# Patient Record
Sex: Male | Born: 1945 | Race: White | Hispanic: No | Marital: Married | Smoking: Current every day smoker
Health system: Southern US, Community
[De-identification: ages and names within clinical notes are randomized; demographics above are authoritative.]

## PROBLEM LIST (undated history)

## (undated) DIAGNOSIS — C801 Malignant (primary) neoplasm, unspecified: Secondary | ICD-10-CM

## (undated) DIAGNOSIS — I10 Essential (primary) hypertension: Secondary | ICD-10-CM

## (undated) DIAGNOSIS — E7800 Pure hypercholesterolemia, unspecified: Secondary | ICD-10-CM

## (undated) DIAGNOSIS — N400 Enlarged prostate without lower urinary tract symptoms: Secondary | ICD-10-CM

## (undated) DIAGNOSIS — E119 Type 2 diabetes mellitus without complications: Secondary | ICD-10-CM

## (undated) HISTORY — PX: TONSILLECTOMY: SUR1361

## (undated) HISTORY — PX: EVALUATION UNDER ANESTHESIA WITH TEAR DUCT PROBING: SHX5620

## (undated) HISTORY — DX: Essential (primary) hypertension: I10

## (undated) HISTORY — DX: Type 2 diabetes mellitus without complications: E11.9

## (undated) HISTORY — DX: Pure hypercholesterolemia, unspecified: E78.00

---

## 2019-11-05 DIAGNOSIS — Z299 Encounter for prophylactic measures, unspecified: Secondary | ICD-10-CM | POA: Diagnosis not present

## 2019-11-05 DIAGNOSIS — E1165 Type 2 diabetes mellitus with hyperglycemia: Secondary | ICD-10-CM | POA: Diagnosis not present

## 2019-11-05 DIAGNOSIS — I10 Essential (primary) hypertension: Secondary | ICD-10-CM | POA: Diagnosis not present

## 2019-11-05 DIAGNOSIS — Z2821 Immunization not carried out because of patient refusal: Secondary | ICD-10-CM | POA: Diagnosis not present

## 2020-03-01 DIAGNOSIS — F17210 Nicotine dependence, cigarettes, uncomplicated: Secondary | ICD-10-CM | POA: Diagnosis not present

## 2020-03-01 DIAGNOSIS — Z299 Encounter for prophylactic measures, unspecified: Secondary | ICD-10-CM | POA: Diagnosis not present

## 2020-03-01 DIAGNOSIS — E1165 Type 2 diabetes mellitus with hyperglycemia: Secondary | ICD-10-CM | POA: Diagnosis not present

## 2020-03-01 DIAGNOSIS — Z7189 Other specified counseling: Secondary | ICD-10-CM | POA: Diagnosis not present

## 2020-03-01 DIAGNOSIS — E7800 Pure hypercholesterolemia, unspecified: Secondary | ICD-10-CM | POA: Diagnosis not present

## 2020-03-01 DIAGNOSIS — Z0000 Encounter for general adult medical examination without abnormal findings: Secondary | ICD-10-CM | POA: Diagnosis not present

## 2020-03-01 DIAGNOSIS — Z6824 Body mass index (BMI) 24.0-24.9, adult: Secondary | ICD-10-CM | POA: Diagnosis not present

## 2020-03-01 DIAGNOSIS — Z6823 Body mass index (BMI) 23.0-23.9, adult: Secondary | ICD-10-CM | POA: Diagnosis not present

## 2020-03-01 DIAGNOSIS — R5383 Other fatigue: Secondary | ICD-10-CM | POA: Diagnosis not present

## 2020-03-01 DIAGNOSIS — Z79899 Other long term (current) drug therapy: Secondary | ICD-10-CM | POA: Diagnosis not present

## 2020-05-18 DIAGNOSIS — I10 Essential (primary) hypertension: Secondary | ICD-10-CM | POA: Diagnosis not present

## 2020-05-18 DIAGNOSIS — E1142 Type 2 diabetes mellitus with diabetic polyneuropathy: Secondary | ICD-10-CM | POA: Diagnosis not present

## 2020-05-18 DIAGNOSIS — K219 Gastro-esophageal reflux disease without esophagitis: Secondary | ICD-10-CM | POA: Diagnosis not present

## 2020-05-18 DIAGNOSIS — E7849 Other hyperlipidemia: Secondary | ICD-10-CM | POA: Diagnosis not present

## 2020-06-13 DIAGNOSIS — Z299 Encounter for prophylactic measures, unspecified: Secondary | ICD-10-CM | POA: Diagnosis not present

## 2020-06-13 DIAGNOSIS — Z2821 Immunization not carried out because of patient refusal: Secondary | ICD-10-CM | POA: Diagnosis not present

## 2020-06-13 DIAGNOSIS — F17210 Nicotine dependence, cigarettes, uncomplicated: Secondary | ICD-10-CM | POA: Diagnosis not present

## 2020-06-13 DIAGNOSIS — I10 Essential (primary) hypertension: Secondary | ICD-10-CM | POA: Diagnosis not present

## 2020-06-13 DIAGNOSIS — E1165 Type 2 diabetes mellitus with hyperglycemia: Secondary | ICD-10-CM | POA: Diagnosis not present

## 2020-07-17 DIAGNOSIS — E119 Type 2 diabetes mellitus without complications: Secondary | ICD-10-CM | POA: Diagnosis not present

## 2020-07-18 DIAGNOSIS — E1142 Type 2 diabetes mellitus with diabetic polyneuropathy: Secondary | ICD-10-CM | POA: Diagnosis not present

## 2020-07-18 DIAGNOSIS — E7849 Other hyperlipidemia: Secondary | ICD-10-CM | POA: Diagnosis not present

## 2020-07-18 DIAGNOSIS — I10 Essential (primary) hypertension: Secondary | ICD-10-CM | POA: Diagnosis not present

## 2020-07-18 DIAGNOSIS — K219 Gastro-esophageal reflux disease without esophagitis: Secondary | ICD-10-CM | POA: Diagnosis not present

## 2020-08-04 ENCOUNTER — Ambulatory Visit (INDEPENDENT_AMBULATORY_CARE_PROVIDER_SITE_OTHER): Payer: Medicare Other | Admitting: Urology

## 2020-08-04 ENCOUNTER — Other Ambulatory Visit: Payer: Self-pay

## 2020-08-04 ENCOUNTER — Encounter: Payer: Self-pay | Admitting: Urology

## 2020-08-04 VITALS — BP 177/74 | HR 72 | Temp 98.4°F | Ht 72.0 in | Wt 162.0 lb

## 2020-08-04 DIAGNOSIS — R339 Retention of urine, unspecified: Secondary | ICD-10-CM

## 2020-08-04 DIAGNOSIS — R972 Elevated prostate specific antigen [PSA]: Secondary | ICD-10-CM

## 2020-08-04 LAB — URINALYSIS, ROUTINE W REFLEX MICROSCOPIC
Bilirubin, UA: NEGATIVE
Glucose, UA: NEGATIVE
Ketones, UA: NEGATIVE
Leukocytes,UA: NEGATIVE
Nitrite, UA: NEGATIVE
Protein,UA: NEGATIVE
Specific Gravity, UA: 1.02 (ref 1.005–1.030)
Urobilinogen, Ur: 1 mg/dL (ref 0.2–1.0)
pH, UA: 6.5 (ref 5.0–7.5)

## 2020-08-04 LAB — MICROSCOPIC EXAMINATION
Bacteria, UA: NONE SEEN
Epithelial Cells (non renal): NONE SEEN /hpf (ref 0–10)
Renal Epithel, UA: NONE SEEN /hpf
WBC, UA: NONE SEEN /hpf (ref 0–5)

## 2020-08-04 LAB — BLADDER SCAN AMB NON-IMAGING: Scan Result: 234

## 2020-08-04 MED ORDER — ALFUZOSIN HCL ER 10 MG PO TB24: 10.0000 mg | ORAL_TABLET | Freq: Every day | ORAL | 11 refills | Status: DC

## 2020-08-04 NOTE — Patient Instructions (Signed)
Prostate-Specific Antigen Test Why am I having this test? The prostate-specific antigen (PSA) test is a screening test for prostate cancer. It can identify early signs of prostate cancer, which may allow for more effective treatment. Your health care provider may recommend that you have a PSA test starting at age 75 or that you have one earlier or later, depending on your risk factors for prostate cancer. You may also have a PSA test:  To monitor treatment of prostate cancer.  To check whether prostate cancer has returned after treatment.  If you have signs of other conditions that can affect PSA levels, such as: ? An enlarged prostate that is not caused by cancer (benign prostatic hyperplasia, BPH). This condition is very common in older men. ? A prostate infection. What is being tested? This test measures the amount of PSA in your blood. PSA is a protein that is made in the prostate. The prostate naturally produces more PSA as you age, but very high levels may be a sign of a medical condition. What kind of sample is taken?  A blood sample is required for this test. It is usually collected by inserting a needle into a blood vessel or by sticking a finger with a small needle. Blood for this test should be drawn before having an exam of the prostate. How do I prepare for this test? Do not ejaculate starting 24 hours before your test, or as long as told by your health care provider. Tell a health care provider about:  Any allergies you have.  All medicines you are taking, including vitamins, herbs, eye drops, creams, and over-the-counter medicines. This also includes: ? Medicines to assist with hair growth, such as finasteride. ? Any recent exposure to a medicine called diethylstilbestrol.  Any blood disorders you have.  Any recent procedures you have had, especially any procedures involving the prostate or rectum.  Any medical conditions you have.  Any recent urinary tract infections  (UTIs) you have had. How are the results reported? Your test results will be reported as a value that indicates how much PSA is in your blood. This will be given as nanograms of PSA per milliliter of blood (ng/mL). Your health care provider will compare your results to normal ranges that were established after testing a large group of people (reference ranges). Reference ranges may vary among labs and hospitals. PSA levels vary from person to person and generally increase with age. Because of this variation, there is no single PSA value that is considered normal for everyone. Instead, PSA reference ranges are used to describe whether your PSA levels are considered low or high (elevated). Common reference ranges are:  Low: 0-2.5 ng/mL.  Slightly to moderately elevated: 2.6-10.0 ng/mL.  Moderately elevated: 10.0-19.9 ng/mL.  Significantly elevated: 20 ng/mL or greater. Sometimes, the test results may report that a condition is present when it is not present (false-positive result). What do the results mean? A test result that is higher than 4 ng/mL may mean that you are at an increased risk for prostate cancer. However, a PSA test by itself is not enough to diagnose prostate cancer. High PSA levels may also be caused by the natural aging process, prostate infection, or BPH. PSA screening cannot tell you if your PSA is high due to cancer or a different cause. A prostate biopsy is the only way to diagnose prostate cancer. A risk of having the PSA test is diagnosing and treating prostate cancer that would never have caused any   symptoms or problems (overdiagnosis and overtreatment). Talk with your health care provider about what your results mean. Questions to ask your health care provider Ask your health care provider, or the department that is doing the test:  When will my results be ready?  How will I get my results?  What are my treatment options?  What other tests do I need?  What are my  next steps? Summary  The prostate-specific antigen (PSA) test is a screening test for prostate cancer.  Your health care provider may recommend that you have a PSA test starting at age 75 or that you have one earlier or later, depending on your risk factors for prostate cancer.  A test result that is higher than 4 ng/mL may mean that you are at an increased risk for prostate cancer. However, elevated levels can be caused by a number of conditions other than prostate cancer.  Talk with your health care provider about what your results mean. This information is not intended to replace advice given to you by your health care provider. Make sure you discuss any questions you have with your health care provider. Document Revised: 07/18/2017 Document Reviewed: 05/12/2017 Elsevier Patient Education  2020 Elsevier Inc.  

## 2020-08-04 NOTE — Progress Notes (Signed)
Bladder Scan Patient can void: 234 ml Performed By: Eunice Oldaker,lpn   Urological Symptom Review  Patient is experiencing the following symptoms: Frequent urination Get up at night to urinate Stream starts and stops Erection problems (male only)  Weak stream   Review of Systems  Gastrointestinal (upper)  : Indigestion/heartburn  Gastrointestinal (lower) : Negative for lower GI symptoms  Constitutional : Negative for symptoms  Skin: Negative for skin symptoms  Eyes: Negative for eye symptoms  Ear/Nose/Throat : Negative for Ear/Nose/Throat symptoms  Hematologic/Lymphatic: Negative for Hematologic/Lymphatic symptoms  Cardiovascular : Negative for cardiovascular symptoms  Respiratory : Cough  Endocrine: Negative for endocrine symptoms  Musculoskeletal: Negative for musculoskeletal symptoms  Neurological: Negative for neurological symptoms  Psychologic: Negative for psychiatric symptoms

## 2020-08-04 NOTE — Progress Notes (Signed)
08/04/2020 1:59 PM   Caleb Pugh 1945-10-09 [id number]  Referring provider: Monico Blitz, MD [place],  [place] [address]  Elevated PSA  HPI: Caleb Pugh is a 74yo here for evaluation of elevated PSA. PSa was 6.3 in 05/2020. He has moderate LUTS on BPH therapy. He has an intermittent weak stream. Nocturia 2-3x. Occasional hesitancy. No starting/stopping.  No dribbling. NO urinary urgency or frequency. No dysuria or hematuria   PMH: Past Medical History:  Diagnosis Date  . Diabetes mellitus without complication ([place])   . High cholesterol   . Hypertension     Surgical History: History reviewed. No pertinent surgical history.  Home Medications:  Allergies as of 08/04/2020   Not on File     Medication List       Accurate as of August 04, 2020  1:59 PM. If you have any questions, ask your nurse or doctor.        Accu-Chek Guide test strip Generic drug: glucose blood   lisinopril-hydrochlorothiazide 20-12.5 MG tablet Commonly known as: ZESTORETIC Take 1 tablet by mouth 2 (two) times daily.   metFORMIN 500 MG tablet Commonly known as: GLUCOPHAGE Take 500 mg by mouth daily.   simvastatin 40 MG tablet Commonly known as: ZOCOR Take 40 mg by mouth at bedtime.       Allergies: Not on File  Family History: History reviewed. No pertinent family history.  Social History:  reports that he has been smoking. He has a 30.00 pack-year smoking history. He has never used smokeless tobacco. No history on file for alcohol use and drug use.  ROS: All other review of systems were reviewed and are negative except what is noted above in HPI  Physical Exam: BP (!) 177/74   Pulse 72   Temp 98.4 F (36.9 C)   Ht 6' (1.829 m)   Wt 162 lb (73.5 kg)   BMI 21.97 kg/m   Constitutional:  Alert and oriented, No acute distress. HEENT: [place] AT, moist mucus membranes.  Trachea midline, no masses. Cardiovascular: No clubbing, cyanosis, or edema. Respiratory: Normal  respiratory effort, no increased work of breathing. GI: Abdomen is soft, nontender, nondistended, no abdominal masses GU: No CVA tenderness. Circumcised phallus. No masses/lesions on penis, testis, scrotum. Prostate 60g smooth no nodules no induration.  Lymph: No cervical or inguinal lymphadenopathy. Skin: No rashes, bruises or suspicious lesions. Neurologic: Grossly intact, no focal deficits, moving all 4 extremities. Psychiatric: Normal mood and affect.  Laboratory Data: No results found for: WBC, HGB, HCT, MCV, PLT  No results found for: CREATININE  No results found for: PSA  No results found for: TESTOSTERONE  No results found for: HGBA1C  Urinalysis No results found for: COLORURINE, APPEARANCEUR, LABSPEC, PHURINE, GLUCOSEU, HGBUR, BILIRUBINUR, KETONESUR, PROTEINUR, UROBILINOGEN, NITRITE, LEUKOCYTESUR  No results found for: LABMICR, [place], RBCUA, LABEPIT, MUCUS, BACTERIA  Pertinent Imaging:  No results found for this or any previous visit.  No results found for this or any previous visit.  No results found for this or any previous visit.  No results found for this or any previous visit.  No results found for this or any previous visit.  No results found for this or any previous visit.  No results found for this or any previous visit.  No results found for this or any previous visit.   Assessment & Plan:    1. Elevated PSA -The patient and I talked about etiologies of elevated PSA.  We discussed the possible relationship between  elevated PSA and prostate cancer, BPH, prostatitis, infection trauma and recent ejaculations.  The patient's PSA has been relatively stable over the interval and as such I recommended that we follow-up with a repeat PSA in 2 months.  If it remains elevated with a positive rising trend we will discuss prostate biopsy at his follow-up appointment. - Urinalysis, Routine w reflex microscopic - BLADDER SCAN AMB NON-IMAGING  2. Incomplete  emptying of bladder -We will trial uroxatral 10mg  qhs   No follow-ups on file.  Nicolette Bang, MD  [facility] [place]

## 2020-09-18 DIAGNOSIS — E7849 Other hyperlipidemia: Secondary | ICD-10-CM | POA: Diagnosis not present

## 2020-09-18 DIAGNOSIS — I10 Essential (primary) hypertension: Secondary | ICD-10-CM | POA: Diagnosis not present

## 2020-09-18 DIAGNOSIS — K219 Gastro-esophageal reflux disease without esophagitis: Secondary | ICD-10-CM | POA: Diagnosis not present

## 2020-09-18 DIAGNOSIS — E1142 Type 2 diabetes mellitus with diabetic polyneuropathy: Secondary | ICD-10-CM | POA: Diagnosis not present

## 2020-09-20 DIAGNOSIS — F17210 Nicotine dependence, cigarettes, uncomplicated: Secondary | ICD-10-CM | POA: Diagnosis not present

## 2020-09-20 DIAGNOSIS — Z6822 Body mass index (BMI) 22.0-22.9, adult: Secondary | ICD-10-CM | POA: Diagnosis not present

## 2020-09-20 DIAGNOSIS — I10 Essential (primary) hypertension: Secondary | ICD-10-CM | POA: Diagnosis not present

## 2020-09-20 DIAGNOSIS — Z299 Encounter for prophylactic measures, unspecified: Secondary | ICD-10-CM | POA: Diagnosis not present

## 2020-09-20 DIAGNOSIS — E1165 Type 2 diabetes mellitus with hyperglycemia: Secondary | ICD-10-CM | POA: Diagnosis not present

## 2020-09-28 DIAGNOSIS — E119 Type 2 diabetes mellitus without complications: Secondary | ICD-10-CM | POA: Diagnosis not present

## 2020-09-28 DIAGNOSIS — H25813 Combined forms of age-related cataract, bilateral: Secondary | ICD-10-CM | POA: Diagnosis not present

## 2020-09-28 DIAGNOSIS — Q141 Congenital malformation of retina: Secondary | ICD-10-CM | POA: Diagnosis not present

## 2020-09-29 ENCOUNTER — Ambulatory Visit (INDEPENDENT_AMBULATORY_CARE_PROVIDER_SITE_OTHER): Payer: Medicare Other | Admitting: Urology

## 2020-09-29 ENCOUNTER — Other Ambulatory Visit: Payer: Self-pay

## 2020-09-29 ENCOUNTER — Encounter: Payer: Self-pay | Admitting: Urology

## 2020-09-29 VITALS — BP 152/70 | HR 68 | Temp 97.9°F | Ht 72.0 in | Wt 160.0 lb

## 2020-09-29 DIAGNOSIS — R339 Retention of urine, unspecified: Secondary | ICD-10-CM | POA: Diagnosis not present

## 2020-09-29 DIAGNOSIS — R972 Elevated prostate specific antigen [PSA]: Secondary | ICD-10-CM | POA: Diagnosis not present

## 2020-09-29 LAB — URINALYSIS, ROUTINE W REFLEX MICROSCOPIC
Bilirubin, UA: NEGATIVE
Glucose, UA: NEGATIVE
Ketones, UA: NEGATIVE
Leukocytes,UA: NEGATIVE
Nitrite, UA: NEGATIVE
Protein,UA: NEGATIVE
RBC, UA: NEGATIVE
Specific Gravity, UA: 1.02 (ref 1.005–1.030)
Urobilinogen, Ur: 1 mg/dL (ref 0.2–1.0)
pH, UA: 7 (ref 5.0–7.5)

## 2020-09-29 NOTE — Patient Instructions (Signed)
  Prostate-Specific Antigen Test Why am I having this test? The prostate-specific antigen (PSA) test is a screening test for prostate cancer. It can identify early signs of prostate cancer, which may allow for more effective treatment. Your health care provider may recommend that you have a PSA test starting at age 76 or that you have one earlier or later, depending on your risk factors for prostate cancer. You may also have a PSA test:  To monitor treatment of prostate cancer.  To check whether prostate cancer has returned after treatment.  If you have signs of other conditions that can affect PSA levels, such as: ? An enlarged prostate that is not caused by cancer (benign prostatic hyperplasia, BPH). This condition is very common in older men. ? A prostate infection. What is being tested? This test measures the amount of PSA in your blood. PSA is a protein that is made in the prostate. The prostate naturally produces more PSA as you age, but very high levels may be a sign of a medical condition. What kind of sample is taken? A blood sample is required for this test. It is usually collected by inserting a needle into a blood vessel or by sticking a finger with a small needle. Blood for this test should be drawn before having an exam of the prostate.   How do I prepare for this test? Do not ejaculate starting 24 hours before your test, or as long as told by your health care provider. Tell a health care provider about:  Any allergies you have.  All medicines you are taking, including vitamins, herbs, eye drops, creams, and over-the-counter medicines. This also includes: ? Medicines to assist with hair growth, such as finasteride. ? Any recent exposure to a medicine called diethylstilbestrol.  Any blood disorders you have.  Any recent procedures you have had, especially any procedures involving the prostate or rectum.  Any medical conditions you have.  Any recent urinary tract  infections (UTIs) you have had. How are the results reported? Your test results will be reported as a value that indicates how much PSA is in your blood. This will be given as nanograms of PSA per milliliter of blood (ng/mL). Your health care provider will compare your results to normal ranges that were established after testing a large group of people (reference ranges). Reference ranges may vary among labs and hospitals. PSA levels vary from person to person and generally increase with age. Because of this variation, there is no single PSA value that is considered normal for everyone. Instead, PSA reference ranges are used to describe whether your PSA levels are considered low or high (elevated). Common reference ranges are:  Low: 0-2.5 ng/mL.  Slightly to moderately elevated: 2.6-10.0 ng/mL.  Moderately elevated: 10.0-19.9 ng/mL.  Significantly elevated: 20 ng/mL or greater. Sometimes, the test results may report that a condition is present when it is not present (false-positive result). What do the results mean? A test result that is higher than 4 ng/mL may mean that you are at an increased risk for prostate cancer. However, a PSA test by itself is not enough to diagnose prostate cancer. High PSA levels may also be caused by the natural aging process, prostate infection, or BPH. PSA screening cannot tell you if your PSA is high due to cancer or a different cause. A prostate biopsy is the only way to diagnose prostate cancer. A risk of having the PSA test is diagnosing and treating prostate cancer that would never   have caused any symptoms or problems (overdiagnosis and overtreatment). Talk with your health care provider about what your results mean. Questions to ask your health care provider Ask your health care provider, or the department that is doing the test:  When will my results be ready?  How will I get my results?  What are my treatment options?  What other tests do I  need?  What are my next steps? Summary  The prostate-specific antigen (PSA) test is a screening test for prostate cancer.  Your health care provider may recommend that you have a PSA test starting at age 76 or that you have one earlier or later, depending on your risk factors for prostate cancer.  A test result that is higher than 4 ng/mL may mean that you are at an increased risk for prostate cancer. However, elevated levels can be caused by a number of conditions other than prostate cancer.  Talk with your health care provider about what your results mean. This information is not intended to replace advice given to you by your health care provider. Make sure you discuss any questions you have with your health care provider. Document Revised: 04/20/2020 Document Reviewed: 04/20/2020 Elsevier Patient Education  2021 Elsevier Inc.  

## 2020-09-29 NOTE — Progress Notes (Signed)
09/29/2020 12:07 PM   Caleb Pugh 1946-04-13 [id number]  Referring provider: Monico Blitz, MD [address] [place],  [place] [address]  Followup incomplete emptying  And elevated PSA  HPI: [name] is a 75yo here for followup for elevated PSA and BPh with LUTS, incomplete emptying. He LUTS have significantly improved with uroxatral 10mg  qhs. PVR 19cc. Nocturia 1x. Stream strong. Urinary urgency and frequency resolved. He is very happy with his urination. IPSS 7   PMH: Past Medical History:  Diagnosis Date  . Diabetes mellitus without complication ([place])   . High cholesterol   . Hypertension     Surgical History: No past surgical history on file.  Home Medications:  Allergies as of 09/29/2020   Not on File     Medication List       Accurate as of September 29, 2020 12:07 PM. If you have any questions, ask your nurse or doctor.        Accu-Chek Guide test strip Generic drug: glucose blood   alfuzosin 10 MG 24 hr tablet Commonly known as: UROXATRAL Take 1 tablet (10 mg total) by mouth at bedtime.   lisinopril-hydrochlorothiazide 20-12.5 MG tablet Commonly known as: ZESTORETIC Take 1 tablet by mouth 2 (two) times daily.   metFORMIN 500 MG tablet Commonly known as: GLUCOPHAGE Take 500 mg by mouth daily.   simvastatin 40 MG tablet Commonly known as: ZOCOR Take 40 mg by mouth at bedtime.       Allergies: Not on File  Family History: No family history on file.  Social History:  reports that he has been smoking. He has a 30.00 pack-year smoking history. He has never used smokeless tobacco. No history on file for alcohol use and drug use.  ROS: All other review of systems were reviewed and are negative except what is noted above in HPI  Physical Exam: BP (!) 152/70   Pulse 68   Temp 97.9 F (36.6 C)   Ht 6' (1.829 m)   Wt 160 lb (72.6 kg)   BMI 21.70 kg/m   Constitutional:  Alert and oriented, No acute distress. HEENT:  AT, moist mucus  membranes.  Trachea midline, no masses. Cardiovascular: No clubbing, cyanosis, or edema. Respiratory: Normal respiratory effort, no increased work of breathing. GI: Abdomen is soft, nontender, nondistended, no abdominal masses GU: No CVA tenderness.  Lymph: No cervical or inguinal lymphadenopathy. Skin: No rashes, bruises or suspicious lesions. Neurologic: Grossly intact, no focal deficits, moving all 4 extremities. Psychiatric: Normal mood and affect.  Laboratory Data: No results found for: WBC, HGB, HCT, MCV, PLT  No results found for: CREATININE  No results found for: PSA  No results found for: TESTOSTERONE  No results found for: HGBA1C  Urinalysis    Component Value Date/Time   APPEARANCEUR Clear 08/04/2020 1348   GLUCOSEU Negative 08/04/2020 1348   BILIRUBINUR Negative 08/04/2020 1348   PROTEINUR Negative 08/04/2020 1348   NITRITE Negative 08/04/2020 1348   LEUKOCYTESUR Negative 08/04/2020 1348    Lab Results  Component Value Date   LABMICR See below: 08/04/2020   WBCUA None seen 08/04/2020   LABEPIT None seen 08/04/2020   BACTERIA None seen 08/04/2020    Pertinent Imaging:  No results found for this or any previous visit.  No results found for this or any previous visit.  No results found for this or any previous visit.  No results found for this or any previous visit.  No results found for this or any previous  visit.  No results found for this or any previous visit.  No results found for this or any previous visit.  No results found for this or any previous visit.   Assessment & Plan:    1. Incomplete emptying of bladder -COntinue uroxatral 10mg  qhs - Urinalysis, Routine w reflex microscopic - Bladder Scan (Post Void Residual) in office  2. Elevated PSA PSA today, will call with results. If it returns to <4,  I will see him back in 6 months with a PSA   No follow-ups on file.  Nicolette Bang, MD  [facility] [place]

## 2020-09-29 NOTE — Progress Notes (Signed)
Urological Symptom Review  Patient is experiencing the following symptoms: Get up at night to urinate Weak stream   Review of Systems  Gastrointestinal (upper)  : Negative for upper GI symptoms  Gastrointestinal (lower) : Negative for lower GI symptoms  Constitutional : Negative for symptoms  Skin: Negative for skin symptoms  Eyes: Negative for eye symptoms  Ear/Nose/Throat : Negative for Ear/Nose/Throat symptoms  Hematologic/Lymphatic: Negative for Hematologic/Lymphatic symptoms  Cardiovascular : Negative for cardiovascular symptoms  Respiratory : Negative for respiratory symptoms  Endocrine: Negative for endocrine symptoms  Musculoskeletal: Negative for musculoskeletal symptoms  Neurological: Negative for neurological symptoms  Psychologic: Negative for psychiatric symptoms  

## 2020-09-30 LAB — PSA: Prostate Specific Ag, Serum: 7.4 ng/mL — ABNORMAL HIGH (ref 0.0–4.0)

## 2020-10-16 DIAGNOSIS — E7849 Other hyperlipidemia: Secondary | ICD-10-CM | POA: Diagnosis not present

## 2020-10-16 DIAGNOSIS — I10 Essential (primary) hypertension: Secondary | ICD-10-CM | POA: Diagnosis not present

## 2020-10-16 DIAGNOSIS — E1142 Type 2 diabetes mellitus with diabetic polyneuropathy: Secondary | ICD-10-CM | POA: Diagnosis not present

## 2020-11-15 DIAGNOSIS — E119 Type 2 diabetes mellitus without complications: Secondary | ICD-10-CM | POA: Diagnosis not present

## 2020-11-15 DIAGNOSIS — E7849 Other hyperlipidemia: Secondary | ICD-10-CM | POA: Diagnosis not present

## 2020-11-15 DIAGNOSIS — I10 Essential (primary) hypertension: Secondary | ICD-10-CM | POA: Diagnosis not present

## 2020-11-20 DIAGNOSIS — H25813 Combined forms of age-related cataract, bilateral: Secondary | ICD-10-CM | POA: Diagnosis not present

## 2020-11-20 DIAGNOSIS — H01002 Unspecified blepharitis right lower eyelid: Secondary | ICD-10-CM | POA: Diagnosis not present

## 2020-11-20 DIAGNOSIS — H01004 Unspecified blepharitis left upper eyelid: Secondary | ICD-10-CM | POA: Diagnosis not present

## 2020-11-20 DIAGNOSIS — H01001 Unspecified blepharitis right upper eyelid: Secondary | ICD-10-CM | POA: Diagnosis not present

## 2020-11-21 NOTE — H&P (Signed)
Surgical History & Physical  Patient Name: Caleb Pugh DOB: 02-08-1946  Surgery: Cataract extraction with intraocular lens implant phacoemulsification; Left Eye  Surgeon: Baruch Goldmann MD Surgery Date:  11/27/2020 Pre-Op Date:  11/20/2020  HPI: A 73 Yr. old male patient is referred by Dr Wynetta Emery for cataract eval. 1. The patient complains of difficulty when viewing TV, reading closed caption, news scrolls on TV, which began many years ago. The left eye is affected. The episode is gradual. The patient describes burning symptoms affecting their eyes/vision. The condition's severity increased since last visit. Symptoms occur when the patient is driving, inside and outside. This is negatively affecting his quality of life. HPI Completed by Dr. Baruch Goldmann  Medical History: Cataracts BPH Diabetes High Blood Pressure LDL  Review of Systems Negative Allergic/Immunologic Negative Cardiovascular Negative Constitutional Negative Ear, Nose, Mouth & Throat Negative Endocrine Negative Eyes Negative Gastrointestinal Negative Genitourinary Negative Hemotologic/Lymphatic Negative Integumentary Negative Musculoskeletal Negative Neurological Negative Psychiatry Negative Respiratory  Social   Current every day smoker of Cigarettes   Medication Alfuzosin, Lisinopril-HCTZ, Metformin, Simvastatin,   Sx/Procedures  None  Drug Allergies   NKDA  History & Physical: Heent:  Cataract, Left eye NECK: supple without bruits LUNGS: lungs clear to auscultation CV: regular rate and rhythm Abdomen: soft and non-tender  Impression & Plan: Assessment: 1.  COMBINED FORMS AGE RELATED CATARACT; Both Eyes (H25.813) 2.  BLEPHARITIS; Right Upper Lid, Right Lower Lid, Left Upper Lid, Left Lower Lid (H01.001, H01.002,H01.004,H01.005) 3.  Pinguecula; Both Eyes (H11.153) 4.  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  Cataract accounts for the patient's decreased vision. This visual  impairment is not correctable with a tolerable change in glasses or contact lenses. Cataract surgery with an implantation of a new lens should significantly improve the visual and functional status of the patient. Discussed all risks, benefits, alternatives, and potential complications. Discussed the procedures and recovery. Patient desires to have surgery. A-scan ordered and performed today for intra-ocular lens calculations. The surgery will be performed in order to improve vision for driving, reading, and for eye examinations. Recommend phacoemulsification with intra-ocular lens. Recommend Dextenza for post-operative pain and inflammation. Left Eye. Dilates poorly - shugacaine by protocol. Malyugin Ring. Omidira. 2.  Recommend regular cleaning. 3.  Observe; Artificial tears as needed for irritation. 4.  Likely based on findings. High sun exposure. Have asked patient to make an emergent appointment with a dermatologist as this can be life treatening.

## 2020-11-22 ENCOUNTER — Encounter (HOSPITAL_COMMUNITY): Payer: Self-pay

## 2020-11-22 ENCOUNTER — Encounter (HOSPITAL_COMMUNITY)
Admission: RE | Admit: 2020-11-22 | Discharge: 2020-11-22 | Disposition: A | Discharge status: Home / Self Care | Payer: Medicare Other | Source: Ambulatory Visit | Attending: Ophthalmology | Admitting: Ophthalmology

## 2020-11-22 ENCOUNTER — Other Ambulatory Visit: Payer: Self-pay

## 2020-11-22 HISTORY — DX: Benign prostatic hyperplasia without lower urinary tract symptoms: N40.0

## 2020-11-23 ENCOUNTER — Other Ambulatory Visit: Payer: Self-pay

## 2020-11-23 ENCOUNTER — Telehealth: Payer: Self-pay

## 2020-11-23 DIAGNOSIS — Z85828 Personal history of other malignant neoplasm of skin: Secondary | ICD-10-CM | POA: Diagnosis not present

## 2020-11-23 DIAGNOSIS — B078 Other viral warts: Secondary | ICD-10-CM | POA: Diagnosis not present

## 2020-11-23 DIAGNOSIS — D0471 Carcinoma in situ of skin of right lower limb, including hip: Secondary | ICD-10-CM | POA: Diagnosis not present

## 2020-11-23 DIAGNOSIS — R972 Elevated prostate specific antigen [PSA]: Secondary | ICD-10-CM

## 2020-11-23 DIAGNOSIS — D485 Neoplasm of uncertain behavior of skin: Secondary | ICD-10-CM | POA: Diagnosis not present

## 2020-11-23 DIAGNOSIS — C44311 Basal cell carcinoma of skin of nose: Secondary | ICD-10-CM | POA: Diagnosis not present

## 2020-11-23 DIAGNOSIS — C44519 Basal cell carcinoma of skin of other part of trunk: Secondary | ICD-10-CM | POA: Diagnosis not present

## 2020-11-23 DIAGNOSIS — C44619 Basal cell carcinoma of skin of left upper limb, including shoulder: Secondary | ICD-10-CM | POA: Diagnosis not present

## 2020-11-23 MED ORDER — LEVOFLOXACIN 750 MG PO TABS: 750.0000 mg | ORAL_TABLET | Freq: Every day | ORAL | 0 refills | Status: DC

## 2020-11-23 NOTE — Telephone Encounter (Signed)
-----   Message from Cleon Gustin, MD sent at 11/21/2020 10:25 AM EDT ----- PSA increased 7.4. He needs to have a prostate biopsy ----- Message ----- From: Valentina Lucks, LPN Sent: [id number]/40/[id number]   9:41 AM EDT To: Cleon Gustin, MD  Pls review.

## 2020-11-23 NOTE — Telephone Encounter (Signed)
Called pt and informed him of results. Went over biopsy instructions and also mailed.

## 2020-11-23 NOTE — Telephone Encounter (Signed)
Called pt gave results. Scheduled biopsy and went over instructions with pt. Also mailed.

## 2020-11-23 NOTE — Telephone Encounter (Signed)
-----   Message from Cleon Gustin, MD sent at 11/21/2020 10:25 AM EDT ----- PSA increased 7.4. He needs to have a prostate biopsy ----- Message ----- From: Valentina Lucks, LPN Sent: 09/19/69   9:41 AM EDT To: Cleon Gustin, MD  Pls review.

## 2020-11-24 ENCOUNTER — Other Ambulatory Visit (HOSPITAL_COMMUNITY)
Admission: RE | Admit: 2020-11-24 | Discharge: 2020-11-24 | Disposition: A | Discharge status: Home / Self Care | Payer: Medicare Other | Source: Ambulatory Visit | Attending: Ophthalmology | Admitting: Ophthalmology

## 2020-11-24 NOTE — Progress Notes (Signed)
Pt. Cataract surgery was canceled. Please refer to Hilda Blades Merchant's note on 4/7 at 11:00.

## 2020-11-27 ENCOUNTER — Encounter (HOSPITAL_COMMUNITY): Admission: RE | Payer: Self-pay | Source: Home / Self Care

## 2020-11-27 ENCOUNTER — Ambulatory Visit (HOSPITAL_COMMUNITY): Admission: RE | Admit: 2020-11-27 | Payer: Medicare Other | Source: Home / Self Care | Admitting: Ophthalmology

## 2020-11-27 DIAGNOSIS — C44722 Squamous cell carcinoma of skin of right lower limb, including hip: Secondary | ICD-10-CM | POA: Diagnosis not present

## 2020-11-27 SURGERY — PHACOEMULSIFICATION, CATARACT, WITH IOL INSERTION
Anesthesia: Monitor Anesthesia Care | Laterality: Left

## 2020-11-30 ENCOUNTER — Other Ambulatory Visit: Payer: Self-pay | Admitting: Ophthalmology

## 2020-11-30 ENCOUNTER — Other Ambulatory Visit (HOSPITAL_COMMUNITY): Payer: Self-pay | Admitting: Ophthalmology

## 2020-11-30 DIAGNOSIS — H2513 Age-related nuclear cataract, bilateral: Secondary | ICD-10-CM | POA: Diagnosis not present

## 2020-11-30 DIAGNOSIS — H0589 Other disorders of orbit: Secondary | ICD-10-CM

## 2020-11-30 DIAGNOSIS — C6961 Malignant neoplasm of right orbit: Secondary | ICD-10-CM | POA: Diagnosis not present

## 2020-11-30 DIAGNOSIS — C441122 Basal cell carcinoma of skin of right lower eyelid, including canthus: Secondary | ICD-10-CM | POA: Diagnosis not present

## 2020-11-30 DIAGNOSIS — C441121 Basal cell carcinoma of skin of right upper eyelid, including canthus: Secondary | ICD-10-CM | POA: Diagnosis not present

## 2020-11-30 NOTE — Patient Instructions (Signed)
Caleb Pugh  11/30/2020     @PREFPERIOPPHARMACY @   Your procedure is scheduled on  12/11/2020.   Report to [name] at  1300 (1:00)  P.M.   Call this number if you have problems the morning of surgery:  [phone]   Remember:  Do not eat or drink after midnight.                         Take these medicines the morning of surgery with A SIP OF WATER  None  DO NOT take any medications for diabetes the morning of your procedure.  If your glucose is 70 or below the morning of your procedure, drink 1/2 cup of clear juice and recheck your glucose in 15 minutes. If your glucose is still 70 or below, call [phone] for instructions.  If your glucose is 300 or above the morning of your procedure, call [phone] for instructions.      Please brush your teeth.  Do not wear jewelry, make-up or nail polish.  Do not wear lotions, powders, or perfumes, or deodorant.  Do not shave 48 hours prior to surgery.  Men may shave face and neck.  Do not bring valuables to the hospital.  [facility] is not responsible for any belongings or valuables.  Contacts, dentures or bridgework may not be worn into surgery.  Leave your suitcase in the car.  After surgery it may be brought to your room.  For patients admitted to the hospital, discharge time will be determined by your treatment team.  Patients discharged the day of surgery will not be allowed to drive home and must have someone with them for 24 hours.    Special instructions:  DO NOT smoke tobacco or vape for 24 hours before your procedure.   Please read over the following fact sheets that you were given. Anesthesia Post-op Instructions and Care and Recovery After Surgery      Cataract Surgery, Care After This sheet gives you information about how to care for yourself after your procedure. Your health care provider may also give you more specific instructions. If you have problems or questions, contact your health  care provider. What can I expect after the procedure? After the procedure, it is common to have:  Itching.  Discomfort.  Fluid discharge.  Sensitivity to light and to touch.  Bruising in or around the eye.  Mild blurred vision. Follow these instructions at home: Eye care  Do not touch or rub your eyes.  Protect your eyes as told by your health care provider. You may be told to wear a protective eye shield or sunglasses.  Do not put a contact lens into the affected eye or eyes until your health care provider approves.  Keep the area around your eye clean and dry: ? Avoid swimming. ? Do not allow water to hit you directly in the face while showering. ? Keep soap and shampoo out of your eyes.  Check your eye every day for signs of infection. Watch for: ? Redness, swelling, or pain. ? Fluid, blood, or pus. ? Warmth. ? A bad smell. ? Vision that is getting worse. ? Sensitivity that is getting worse.   Activity  Do not drive for 24 hours if you were given a sedative during your procedure.  Avoid strenuous activities, such as playing contact sports, for as long as told by your health care provider.  Do not drive or use  heavy machinery until your health care provider approves.  Do not bend or lift heavy objects. Bending increases pressure in the eye. You can walk, climb stairs, and do light household chores.  Ask your health care provider when you can return to work. If you work in a dusty environment, you may be advised to wear protective eyewear for a period of time. General instructions  Take or apply over-the-counter and prescription medicines only as told by your health care provider. This includes eye drops.  Keep all follow-up visits as told by your health care provider. This is important. Contact a health care provider if:  You have increased bruising around your eye.  You have pain that is not helped with medicine.  You have a fever.  You have redness,  swelling, or pain in your eye.  You have fluid, blood, or pus coming from your incision.  Your vision gets worse.  Your sensitivity to light gets worse. Get help right away if:  You have sudden loss of vision.  You see flashes of light or spots (floaters).  You have severe eye pain.  You develop nausea or vomiting. Summary  After your procedure, it is common to have itching, discomfort, bruising, fluid discharge, or sensitivity to light.  Follow instructions from your health care provider about caring for your eye after the procedure.  Do not rub your eye after the procedure. You may need to wear eye protection or sunglasses. Do not wear contact lenses. Keep the area around your eye clean and dry.  Avoid activities that require a lot of effort. These include playing sports and lifting heavy objects.  Contact a health care provider if you have increased bruising, pain that does not go away, or a fever. Get help right away if you suddenly lose your vision, see flashes of light or spots, or have severe pain in the eye. This information is not intended to replace advice given to you by your health care provider. Make sure you discuss any questions you have with your health care provider. Document Revised: 06/01/2019 Document Reviewed: 02/02/2018 Elsevier Patient Education  [place].

## 2020-12-01 ENCOUNTER — Ambulatory Visit (HOSPITAL_COMMUNITY)
Admission: RE | Admit: 2020-12-01 | Discharge: 2020-12-01 | Disposition: A | Discharge status: Home / Self Care | Payer: Medicare Other | Source: Ambulatory Visit | Attending: Ophthalmology | Admitting: Ophthalmology

## 2020-12-01 DIAGNOSIS — J322 Chronic ethmoidal sinusitis: Secondary | ICD-10-CM | POA: Diagnosis not present

## 2020-12-01 DIAGNOSIS — H0589 Other disorders of orbit: Secondary | ICD-10-CM | POA: Diagnosis not present

## 2020-12-01 DIAGNOSIS — C44319 Basal cell carcinoma of skin of other parts of face: Secondary | ICD-10-CM | POA: Diagnosis not present

## 2020-12-01 DIAGNOSIS — C44111 Basal cell carcinoma of skin of unspecified eyelid, including canthus: Secondary | ICD-10-CM | POA: Diagnosis not present

## 2020-12-01 DIAGNOSIS — J321 Chronic frontal sinusitis: Secondary | ICD-10-CM | POA: Diagnosis not present

## 2020-12-01 IMAGING — MR MR ORBITS WO/W CM
4 of 7 series · 23 of 48 positions shown · IV contrast (gadavist)
Comparison: None.

CLINICAL DATA: Basal cell carcinoma right medial canthus. Rule out
orbital extension.

EXAM:
MRI OF THE ORBITS WITHOUT AND WITH CONTRAST
TECHNIQUE: Multiplanar, multisequence MR imaging of the orbits was performed
both before and after the administration of intravenous contrast.
CONTRAST:  7.5mL GADAVIST GADOBUTROL 1 MMOL/ML IV SOLN

[Series 5: T1 · sagittal · B · 3.0mm · 0.38mm/px · 6 of 33 slices shown (1 of 2)]
[im 1/33]
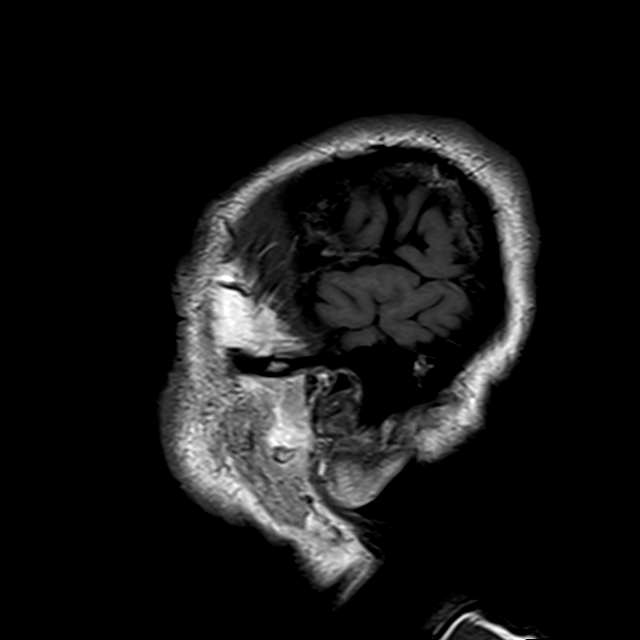
[im 6/33]
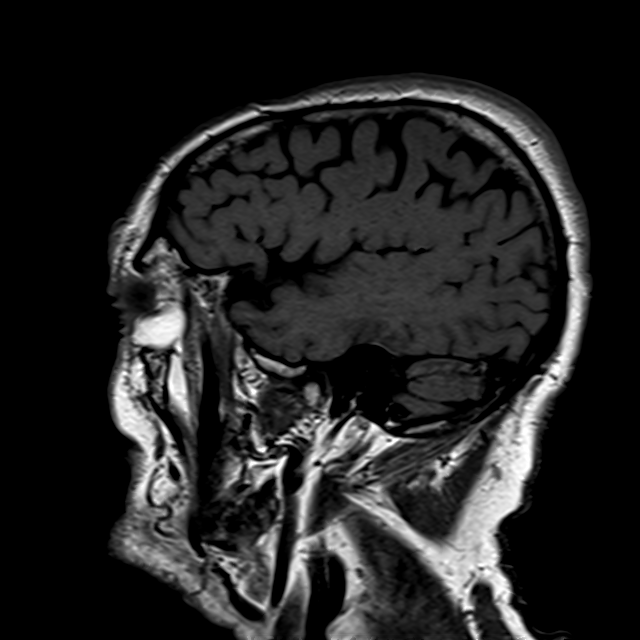
[im 11/33]
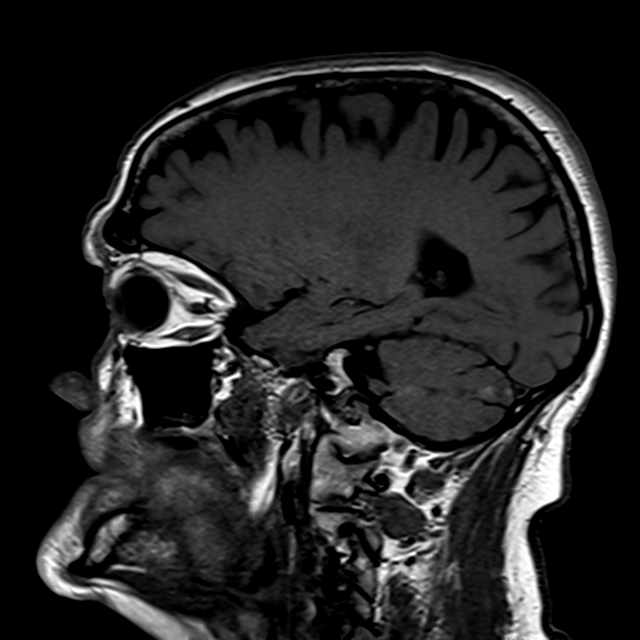
[im 17/33]
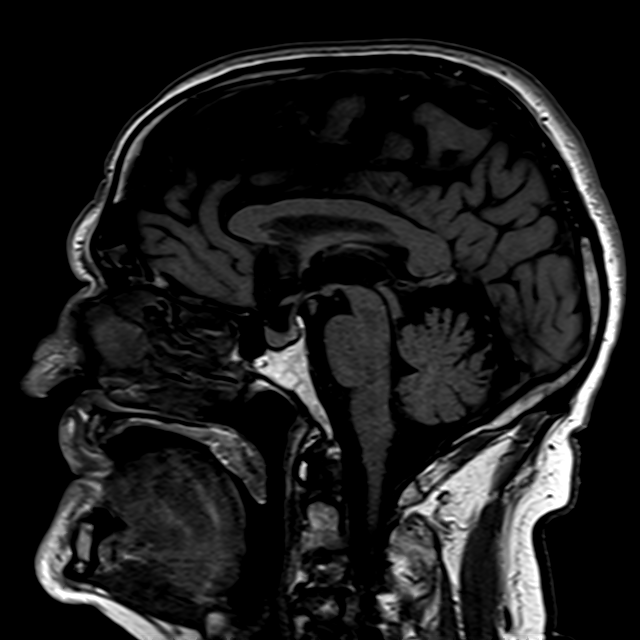
[im 22/33]
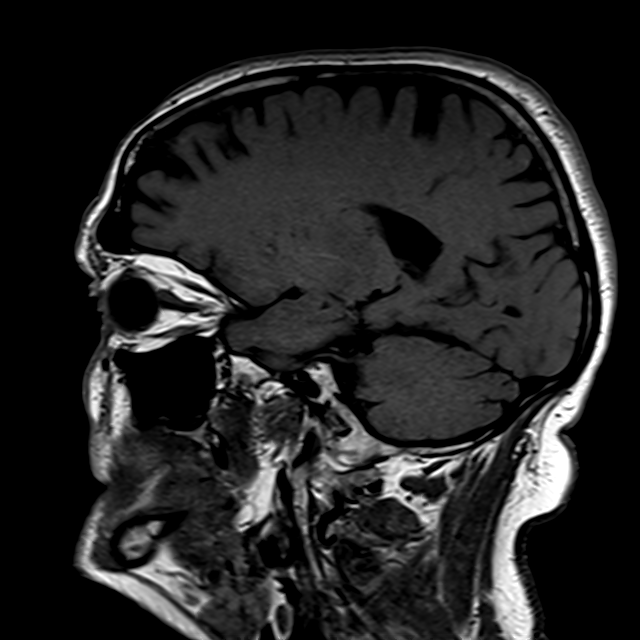
[im 27/33]
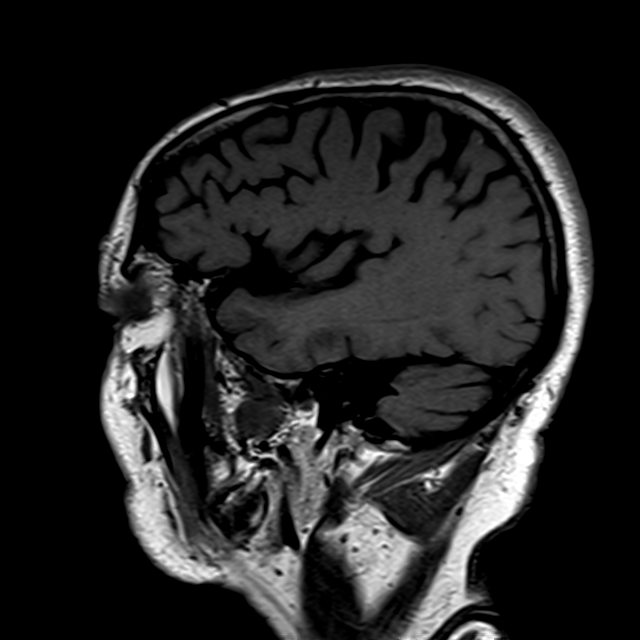

[Series 6: T1 · coronal · B · 3.0mm · 0.37mm/px · 3 of 30 slices shown (2 of 2)]
[im 5/30]
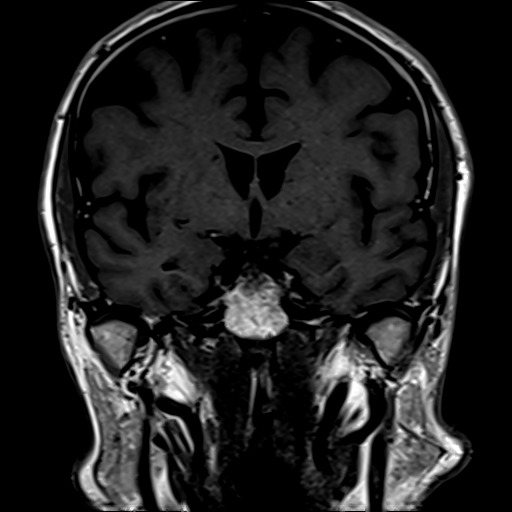
[im 15/30]
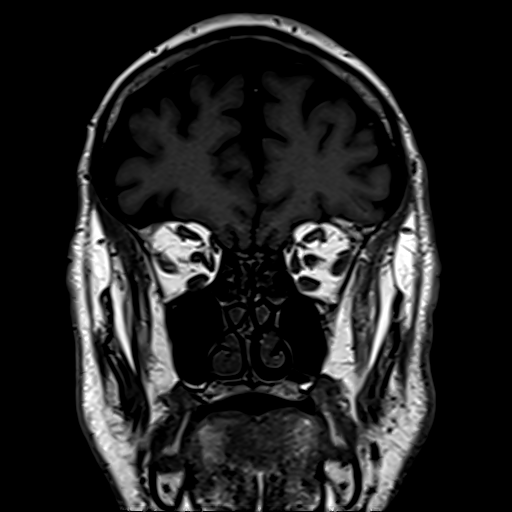
[im 25/30]
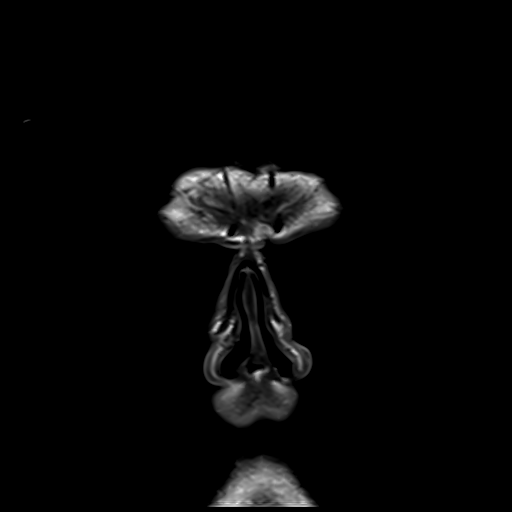

[Series 9: T2 fat-sat · coronal · B · 3.0mm · 0.54mm/px · 8 of 30 slices shown (1 of 2)]
[im 1/30]
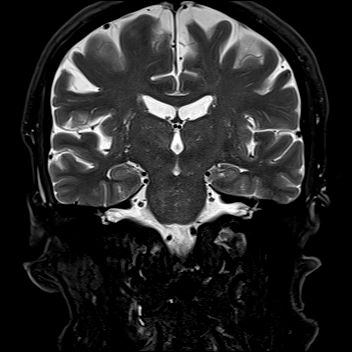
[im 5/30]
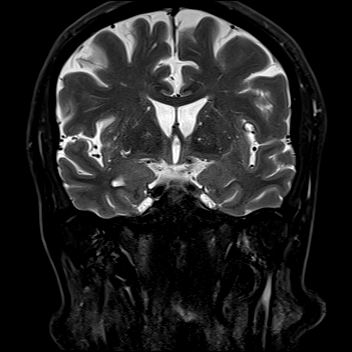
[im 9/30]
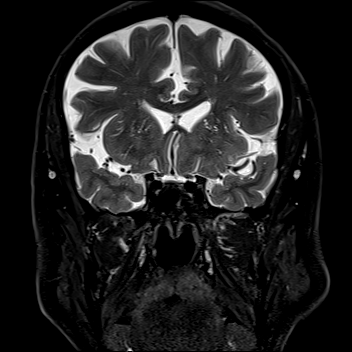
[im 13/30]
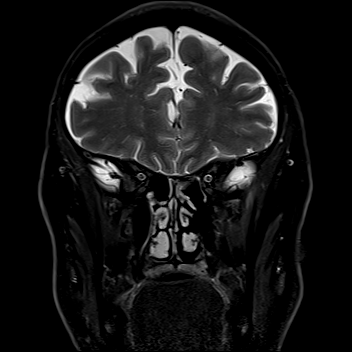
[im 17/30]
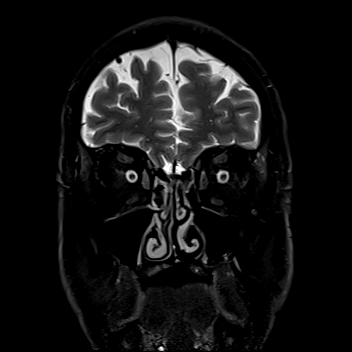
[im 21/30]
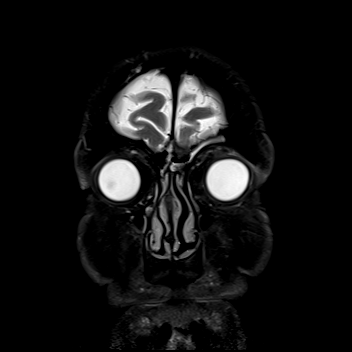
[im 25/30]
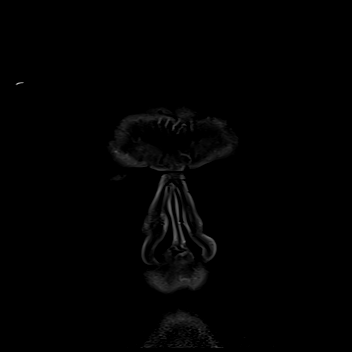
[im 30/30]
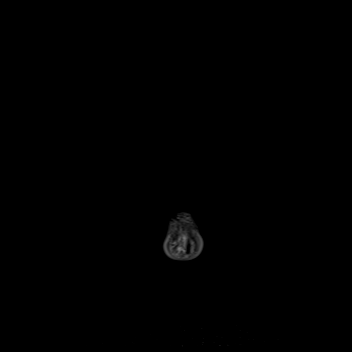

[Series 13: T2 fat-sat · axial · B · 3.0mm · 0.54mm/px · z∈[-16,+71]mm · 6 of 22 slices shown (2 of 2)]
[im 1/22]
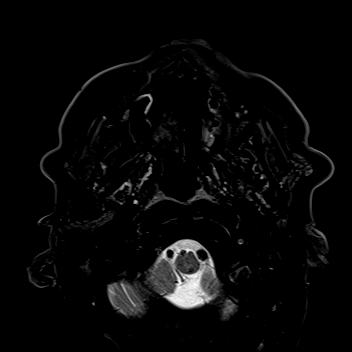
[im 5/22]
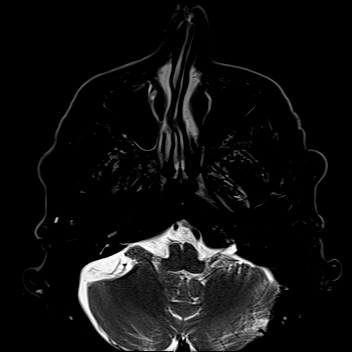
[im 9/22]
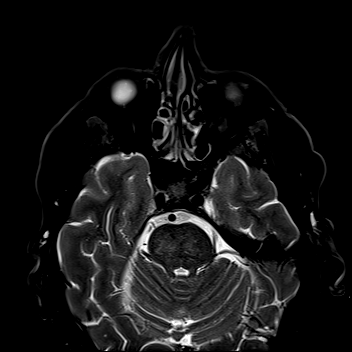
[im 13/22]
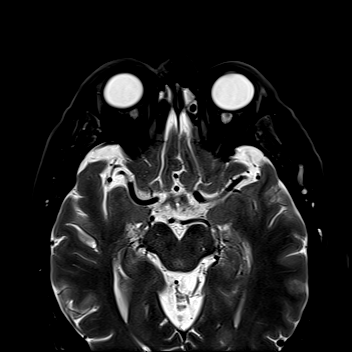
[im 17/22]
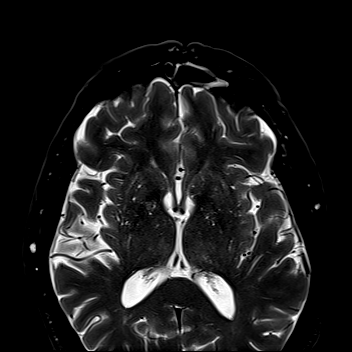
[im 22/22]
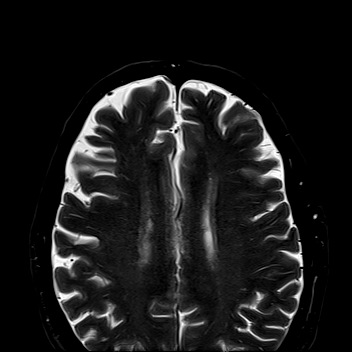

[23 of 48 positions shown; findings below may reference images not displayed]

FINDINGS: Orbit: Normal globe bilaterally. Normal extraocular muscles and
optic nerve. Optic chiasm normal. Cavernous sinus normal
bilaterally. Orbital fat normal bilaterally without infiltration.

Small enhancing skin lesion right medial canthus compatible with
known basal cell carcinoma. This measures approximately 5 x 10 mm.
No intraorbital extension. No bony extension identified.

Sinuses: Mild mucosal edema paranasal sinuses specially left frontal
and ethmoid sinuses but also diffusely. Mastoid sinus clear
bilaterally.

Limited intracranial imaging: Mild atrophy. Patchy hyperintensity in
the pons and white matter likely due to chronic microvascular
ischemia. Negative for hydrocephalus. Normal arterial flow voids at
the base of brain.

Skeletal: Negative skull base.  No focal skeletal lesion.
IMPRESSION: 5 x 10 mm basal cell carcinoma right medial canthus. No intraorbital
extension.

These results were called by telephone at the time of interpretation
on 12/01/2020 at [DATE] to provider EMELANCINNE DON DE DIEU , who verbally
acknowledged these results.

## 2020-12-01 MED ORDER — GADOBUTROL 1 MMOL/ML IV SOLN
7.5000 mL | Freq: Once | INTRAVENOUS | Status: AC | PRN
  Administered 2020-12-01: 7.5 mL via INTRAVENOUS

## 2020-12-04 DIAGNOSIS — H25812 Combined forms of age-related cataract, left eye: Secondary | ICD-10-CM | POA: Diagnosis not present

## 2020-12-05 NOTE — H&P (Signed)
Surgical History & Physical  Patient Name: Caleb Pugh DOB: 1945-12-24  Surgery: Cataract extraction with intraocular lens implant phacoemulsification; Left Eye  Surgeon: Baruch Goldmann MD Surgery Date:  12/11/2020 Pre-Op Date:  11/20/2020  HPI: A 79 Yr. old male patient is referred by Dr Wynetta Emery for cataract eval. 1. The patient complains of difficulty when viewing TV, reading closed caption, news scrolls on TV, which began many years ago. The left eye is affected. The episode is gradual. The patient describes burning symptoms affecting their eyes/vision. The condition's severity increased since last visit. Symptoms occur when the patient is driving, inside and outside. This is negatively affecting his quality of life. HPI Completed by Dr. Baruch Goldmann  Medical History: Cataracts BPH Diabetes High Blood Pressure LDL  Review of Systems Negative Allergic/Immunologic Negative Cardiovascular Negative Constitutional Negative Ear, Nose, Mouth & Throat Negative Endocrine Negative Eyes Negative Gastrointestinal Negative Genitourinary Negative Hemotologic/Lymphatic Negative Integumentary Negative Musculoskeletal Negative Neurological Negative Psychiatry Negative Respiratory  Social   Current every day smoker of Cigarettes   Medication Alfuzosin, Lisinopril-HCTZ, Metformin, Simvastatin,  Sx/Procedures  None  Drug Allergies   NKDA  History & Physical: Heent:  Cataract, Left eye NECK: supple without bruits LUNGS: lungs clear to auscultation CV: regular rate and rhythm Abdomen: soft and non-tender  Impression & Plan: Assessment: 1.  COMBINED FORMS AGE RELATED CATARACT; Both Eyes (H25.813) 2.  BLEPHARITIS; Right Upper Lid, Right Lower Lid, Left Upper Lid, Left Lower Lid (H01.001, H01.002,H01.004,H01.005) 3.  Pinguecula; Both Eyes (H11.153) 4.  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  Cataract accounts for the patient's decreased vision. This visual  impairment is not correctable with a tolerable change in glasses or contact lenses. Cataract surgery with an implantation of a new lens should significantly improve the visual and functional status of the patient. Discussed all risks, benefits, alternatives, and potential complications. Discussed the procedures and recovery. Patient desires to have surgery. A-scan ordered and performed today for intra-ocular lens calculations. The surgery will be performed in order to improve vision for driving, reading, and for eye examinations. Recommend phacoemulsification with intra-ocular lens. Recommend Dextenza for post-operative pain and inflammation. Left  Eye. Dilates poorly - shugacaine by protocol. Malyugin Ring. Omidira. 2.  Recommend regular cleaning. 3.  Observe; Artificial tears as needed for irritation. 4.  Likely based on findings. High sun exposure. Have asked patient to make an emergent appointment with a dermatologist as this can be life treatening.

## 2020-12-06 ENCOUNTER — Other Ambulatory Visit: Payer: Self-pay

## 2020-12-06 ENCOUNTER — Encounter (HOSPITAL_COMMUNITY): Payer: Self-pay

## 2020-12-06 ENCOUNTER — Encounter (HOSPITAL_COMMUNITY)
Admission: RE | Admit: 2020-12-06 | Discharge: 2020-12-06 | Disposition: A | Discharge status: Home / Self Care | Payer: Medicare Other | Source: Ambulatory Visit | Attending: Ophthalmology | Admitting: Ophthalmology

## 2020-12-06 ENCOUNTER — Telehealth: Payer: Self-pay | Admitting: Urology

## 2020-12-06 NOTE — Telephone Encounter (Signed)
Pt needs to change his biopsy date. He is having eye surgery the same day. Please call him when you can.

## 2020-12-08 ENCOUNTER — Other Ambulatory Visit: Payer: Self-pay

## 2020-12-08 ENCOUNTER — Other Ambulatory Visit (HOSPITAL_COMMUNITY)
Admission: RE | Admit: 2020-12-08 | Discharge: 2020-12-08 | Disposition: A | Discharge status: Home / Self Care | Payer: Medicare Other | Source: Ambulatory Visit | Attending: Ophthalmology | Admitting: Ophthalmology

## 2020-12-08 ENCOUNTER — Other Ambulatory Visit (HOSPITAL_COMMUNITY): Payer: Medicare Other

## 2020-12-08 DIAGNOSIS — Z01812 Encounter for preprocedural laboratory examination: Secondary | ICD-10-CM | POA: Insufficient documentation

## 2020-12-08 DIAGNOSIS — R972 Elevated prostate specific antigen [PSA]: Secondary | ICD-10-CM

## 2020-12-08 DIAGNOSIS — Z20822 Contact with and (suspected) exposure to covid-19: Secondary | ICD-10-CM | POA: Diagnosis not present

## 2020-12-08 MED ORDER — LEVOFLOXACIN 750 MG PO TABS: 750.0000 mg | ORAL_TABLET | Freq: Once | ORAL | 0 refills | Status: AC

## 2020-12-08 NOTE — Telephone Encounter (Signed)
I returned patient call.  Patient expressed his frustrations that he followed his biopsy instructions letter and went to hospital on 12/06/2020 as letter stated but was informed by radiology his appt was not until 4/25. Patient not able to make 4/25. New appt created with patient for May 18th. New levaquin order submitted to pharmacy. Patient voiced understanding of biopsy instructions.

## 2020-12-09 LAB — SARS CORONAVIRUS 2 (TAT 6-24 HRS): SARS Coronavirus 2: NEGATIVE

## 2020-12-11 ENCOUNTER — Encounter (HOSPITAL_COMMUNITY)
Admission: RE | Disposition: A | Discharge status: Home / Self Care | Payer: Self-pay | Source: Home / Self Care | Attending: Ophthalmology

## 2020-12-11 ENCOUNTER — Ambulatory Visit (HOSPITAL_COMMUNITY): Payer: Medicare Other | Admitting: Anesthesiology

## 2020-12-11 ENCOUNTER — Encounter (HOSPITAL_COMMUNITY): Payer: Self-pay | Admitting: Ophthalmology

## 2020-12-11 ENCOUNTER — Ambulatory Visit (HOSPITAL_COMMUNITY)
Admission: RE | Admit: 2020-12-11 | Discharge: 2020-12-11 | Disposition: A | Discharge status: Home / Self Care | Payer: Medicare Other | Source: Ambulatory Visit | Attending: Urology | Admitting: Urology

## 2020-12-11 ENCOUNTER — Ambulatory Visit (HOSPITAL_COMMUNITY)
Admission: RE | Admit: 2020-12-11 | Discharge: 2020-12-11 | Disposition: A | Discharge status: Home / Self Care | Payer: Medicare Other | Attending: Ophthalmology | Admitting: Ophthalmology

## 2020-12-11 ENCOUNTER — Encounter (HOSPITAL_COMMUNITY): Payer: Self-pay

## 2020-12-11 ENCOUNTER — Other Ambulatory Visit: Payer: Medicare Other | Admitting: Urology

## 2020-12-11 DIAGNOSIS — H25812 Combined forms of age-related cataract, left eye: Secondary | ICD-10-CM | POA: Diagnosis not present

## 2020-12-11 DIAGNOSIS — Z79899 Other long term (current) drug therapy: Secondary | ICD-10-CM | POA: Insufficient documentation

## 2020-12-11 DIAGNOSIS — H2181 Floppy iris syndrome: Secondary | ICD-10-CM | POA: Diagnosis not present

## 2020-12-11 DIAGNOSIS — H0100B Unspecified blepharitis left eye, upper and lower eyelids: Secondary | ICD-10-CM | POA: Insufficient documentation

## 2020-12-11 DIAGNOSIS — F17210 Nicotine dependence, cigarettes, uncomplicated: Secondary | ICD-10-CM | POA: Diagnosis not present

## 2020-12-11 DIAGNOSIS — Z7984 Long term (current) use of oral hypoglycemic drugs: Secondary | ICD-10-CM | POA: Diagnosis not present

## 2020-12-11 DIAGNOSIS — E1136 Type 2 diabetes mellitus with diabetic cataract: Secondary | ICD-10-CM | POA: Diagnosis not present

## 2020-12-11 DIAGNOSIS — H0100A Unspecified blepharitis right eye, upper and lower eyelids: Secondary | ICD-10-CM | POA: Insufficient documentation

## 2020-12-11 DIAGNOSIS — R972 Elevated prostate specific antigen [PSA]: Secondary | ICD-10-CM

## 2020-12-11 DIAGNOSIS — H11153 Pinguecula, bilateral: Secondary | ICD-10-CM | POA: Diagnosis not present

## 2020-12-11 DIAGNOSIS — F17200 Nicotine dependence, unspecified, uncomplicated: Secondary | ICD-10-CM | POA: Diagnosis not present

## 2020-12-11 HISTORY — PX: CATARACT EXTRACTION W/PHACO: SHX586

## 2020-12-11 LAB — GLUCOSE, CAPILLARY: Glucose-Capillary: 132 mg/dL — ABNORMAL HIGH (ref 70–99)

## 2020-12-11 SURGERY — PHACOEMULSIFICATION, CATARACT, WITH IOL INSERTION
Anesthesia: Monitor Anesthesia Care | Site: Eye | Laterality: Left

## 2020-12-11 MED ORDER — EPINEPHRINE PF 1 MG/ML IJ SOLN
INTRAOCULAR | Status: DC | PRN
  Administered 2020-12-11: 500 mL

## 2020-12-11 MED ORDER — SODIUM HYALURONATE 23MG/ML IO SOSY
PREFILLED_SYRINGE | INTRAOCULAR | Status: DC | PRN
  Administered 2020-12-11: 0.6 mL via INTRAOCULAR

## 2020-12-11 MED ORDER — NEOMYCIN-POLYMYXIN-DEXAMETH 3.5-10000-0.1 OP SUSP
OPHTHALMIC | Status: DC | PRN
  Administered 2020-12-11: 1 [drp] via OPHTHALMIC

## 2020-12-11 MED ORDER — TROPICAMIDE 1 % OP SOLN
1.0000 [drp] | OPHTHALMIC | Status: AC
  Administered 2020-12-11 (×3): 1 [drp] via OPHTHALMIC

## 2020-12-11 MED ORDER — PHENYLEPHRINE-KETOROLAC 1-0.3 % IO SOLN
INTRAOCULAR | Status: AC
  Filled 2020-12-11: qty 4

## 2020-12-11 MED ORDER — STERILE WATER FOR IRRIGATION IR SOLN
Status: DC | PRN
  Administered 2020-12-11: 250 mL

## 2020-12-11 MED ORDER — EPINEPHRINE PF 1 MG/ML IJ SOLN
INTRAMUSCULAR | Status: AC
  Filled 2020-12-11: qty 1

## 2020-12-11 MED ORDER — SODIUM HYALURONATE 10 MG/ML IO SOLUTION
PREFILLED_SYRINGE | INTRAOCULAR | Status: DC | PRN
  Administered 2020-12-11: 0.85 mL via INTRAOCULAR

## 2020-12-11 MED ORDER — TETRACAINE HCL 0.5 % OP SOLN
1.0000 [drp] | OPHTHALMIC | Status: AC | PRN
  Administered 2020-12-11 (×3): 1 [drp] via OPHTHALMIC

## 2020-12-11 MED ORDER — PHENYLEPHRINE HCL 2.5 % OP SOLN
1.0000 [drp] | OPHTHALMIC | Status: AC | PRN
  Administered 2020-12-11 (×3): 1 [drp] via OPHTHALMIC

## 2020-12-11 MED ORDER — LIDOCAINE HCL (PF) 1 % IJ SOLN
INTRAOCULAR | Status: DC | PRN
  Administered 2020-12-11: 1 mL via OPHTHALMIC

## 2020-12-11 MED ORDER — LIDOCAINE HCL 3.5 % OP GEL
1.0000 "application " | Freq: Once | OPHTHALMIC | Status: AC
  Administered 2020-12-11: 1 via OPHTHALMIC

## 2020-12-11 MED ORDER — POVIDONE-IODINE 5 % OP SOLN
OPHTHALMIC | Status: DC | PRN
  Administered 2020-12-11: 1 via OPHTHALMIC

## 2020-12-11 MED ORDER — BSS IO SOLN
INTRAOCULAR | Status: DC | PRN
  Administered 2020-12-11: 15 mL

## 2020-12-11 SURGICAL SUPPLY — 12 items
CLOTH BEACON ORANGE TIMEOUT ST (SAFETY) ×2 IMPLANT
EYE SHIELD UNIVERSAL CLEAR (GAUZE/BANDAGES/DRESSINGS) ×2 IMPLANT
GLOVE SURG UNDER POLY LF SZ7 (GLOVE) ×4 IMPLANT
NEEDLE HYPO 18GX1.5 BLUNT FILL (NEEDLE) ×2 IMPLANT
PAD ARMBOARD 7.5X6 YLW CONV (MISCELLANEOUS) ×2 IMPLANT
RING MALYGIN 7.0 (MISCELLANEOUS) IMPLANT
SYR TB 1ML LL NO SAFETY (SYRINGE) ×2 IMPLANT
TAPE SURG TRANSPORE 1 IN (GAUZE/BANDAGES/DRESSINGS) ×1 IMPLANT
TAPE SURGICAL TRANSPORE 1 IN (GAUZE/BANDAGES/DRESSINGS) ×1
TECNIS 1 PIECE IOL (Intraocular Lens) ×2 IMPLANT
VISCOELASTIC ADDITIONAL (OPHTHALMIC RELATED) IMPLANT
WATER STERILE IRR 250ML POUR (IV SOLUTION) ×2 IMPLANT

## 2020-12-11 NOTE — Anesthesia Postprocedure Evaluation (Signed)
Anesthesia Post Note  Patient: Caleb Pugh  Procedure(s) Performed: CATARACT EXTRACTION PHACO AND INTRAOCULAR LENS PLACEMENT LEFT EYE (Left Eye)  Patient location during evaluation: Short Stay Anesthesia Type: MAC Level of consciousness: awake and alert and oriented Pain management: pain level controlled Vital Signs Assessment: post-procedure vital signs reviewed and stable Respiratory status: spontaneous breathing Cardiovascular status: blood pressure returned to baseline and stable Postop Assessment: no apparent nausea or vomiting Anesthetic complications: no   No complications documented.   Last Vitals:  Vitals:   12/11/20 1037 12/11/20 1152  BP: (!) 185/75 (!) 181/91  Pulse: 85 78  Resp: 20 18  Temp: 36.5 C 36.6 C  SpO2: 100% 98%    Last Pain:  Vitals:   12/11/20 1152  TempSrc: Oral  PainSc: 0-No pain                 Zakayla Martinec

## 2020-12-11 NOTE — Anesthesia Preprocedure Evaluation (Addendum)
Anesthesia Evaluation  Patient identified by MRN, date of birth, ID band Patient awake    Reviewed: Allergy & Precautions, NPO status , Patient's Chart, lab work & pertinent test results  Airway Mallampati: II  TM Distance: >3 FB Neck ROM: Full    Dental  (+) Edentulous Upper, Edentulous Lower   Pulmonary Current Smoker and Patient abstained from smoking.,    Pulmonary exam normal breath sounds clear to auscultation       Cardiovascular hypertension, Pt. on medications Normal cardiovascular exam Rhythm:Regular Rate:Normal     Neuro/Psych negative neurological ROS  negative psych ROS   GI/Hepatic negative GI ROS, Neg liver ROS,   Endo/Other  diabetes, Well Controlled, Type 2, Oral Hypoglycemic Agents  Renal/GU negative Renal ROS     Musculoskeletal negative musculoskeletal ROS (+)   Abdominal   Peds  Hematology negative hematology ROS (+)   Anesthesia Other Findings Left face skin cancer   Reproductive/Obstetrics negative OB ROS                            Anesthesia Physical Anesthesia Plan  ASA: II  Anesthesia Plan: MAC   Post-op Pain Management:    Induction:   PONV Risk Score and Plan:   Airway Management Planned: Nasal Cannula and Natural Airway  Additional Equipment:   Intra-op Plan:   Post-operative Plan:   Informed Consent: I have reviewed the patients History and Physical, chart, labs and discussed the procedure including the risks, benefits and alternatives for the proposed anesthesia with the patient or authorized representative who has indicated his/her understanding and acceptance.     Dental advisory given  Plan Discussed with: CRNA and Surgeon  Anesthesia Plan Comments:         Anesthesia Quick Evaluation

## 2020-12-11 NOTE — Op Note (Signed)
Date of procedure: 12/11/20  Pre-operative diagnosis: Visually significant combined-form age-related cataract, Left Eye; Poor dilation, Left eye (H25.812; H21.81)  Post-operative diagnosis: Visually significant cataract, Left Eye; Intra-operative Floppy Iris Syndrome, Left Eye  Procedure: Complex removal of cataract via phacoemulsification and insertion of intra-ocular lens [employer] DCB00  +20.5D into the capsular bag of the Left Eye (CPT [phone])  Attending surgeon: Gerda Diss. Soniyah Mcglory, MD, MA  Anesthesia: MAC, Topical Akten  Complications: None  Estimated Blood Loss: <50m (minimal)  Specimens: None  Implants: As above  Indications:  Visually significant cataract, Left Eye  Procedure:  The patient was seen and identified in the pre-operative area. The operative eye was identified and dilated.  The operative eye was marked.  Topical anesthesia was administered to the operative eye.     The patient was then to the operative suite and placed in the supine position.  A timeout was performed confirming the patient, procedure to be performed, and all other relevant information.   The patient's face was prepped and draped in the usual fashion for intra-ocular surgery.  A lid speculum was placed into the operative eye and the surgical microscope moved into place and focused.  Poor dilation of the iris was confirmed.  An inferotemporal paracentesis was created using a 20 gauge paracentesis blade.  Shugarcaine was injected into the anterior chamber.  Viscoelastic was injected into the anterior chamber.  A temporal clear-corneal main wound incision was created using a 2.438mmicrokeratome.  A Malyugin ring was placed.  A continuous curvilinear capsulorrhexis was initiated using an irrigating cystitome and completed using capsulorrhexis forceps.  Hydrodissection and hydrodeliniation were performed.  Viscoelastic was injected into the anterior chamber.  A phacoemulsification handpiece and a  chopper as a second instrument were used to remove the nucleus and epinucleus. The irrigation/aspiration handpiece was used to remove any remaining cortical material.   The capsular bag was reinflated with viscoelastic, checked, and found to be intact.  The intraocular lens was inserted into the capsular bag and dialed into place using a Kuglen hook. The Malyugin ring was removed.  The irrigation/aspiration handpiece was used to remove any remaining viscoelastic.  The clear corneal wound and paracentesis wounds were then hydrated and checked with Weck-Cels to be watertight.  The lid-speculum and drape was removed, and the patient's face was cleaned with a wet and dry 4x4.  Maxitrol was instilled in the eye before a clear shield was taped over the eye. The patient was taken to the post-operative care unit in good condition, having tolerated the procedure well.  Post-Op Instructions: [facility] and will receive all other orders and instructions.

## 2020-12-11 NOTE — Discharge Instructions (Addendum)
Please discharge patient when stable, will follow up today with Dr. Wrzosek at the [facility] office immediately following discharge.  Leave shield in place until visit.  All paperwork with discharge instructions will be given at the office.   [facility] Address:  [address]  [place], [place] [address]  

## 2020-12-11 NOTE — Interval H&P Note (Signed)
History and Physical Interval Note:  12/11/2020 11:20 AM  Caleb Pugh  has presented today for surgery, with the diagnosis of Nuclear sclerotic cataract - Left eye.  The various methods of treatment have been discussed with the patient and family. After consideration of risks, benefits and other options for treatment, the patient has consented to  Procedure(s) with comments: CATARACT EXTRACTION PHACO AND INTRAOCULAR LENS PLACEMENT LEFT EYE (Left) - left as a surgical intervention.  The patient's history has been reviewed, patient examined, no change in status, stable for surgery.  I have reviewed the patient's chart and labs.  Questions were answered to the patient's satisfaction.     Baruch Goldmann

## 2020-12-11 NOTE — Transfer of Care (Signed)
Immediate Anesthesia Transfer of Care Note  Patient: Caleb Pugh  Procedure(s) Performed: CATARACT EXTRACTION PHACO AND INTRAOCULAR LENS PLACEMENT LEFT EYE (Left Eye)  Patient Location: Short Stay  Anesthesia Type:MAC  Level of Consciousness: awake  Airway & Oxygen Therapy: Patient Spontanous Breathing  Post-op Assessment: Report given to RN  Post vital signs: Reviewed  Last Vitals:  Vitals Value Taken Time  BP 181/91 12/11/20 1152  Temp 36.6 C 12/11/20 1152  Pulse 78 12/11/20 1152  Resp 18 12/11/20 1152  SpO2 98 % 12/11/20 1152    Last Pain:  Vitals:   12/11/20 1152  TempSrc: Oral  PainSc: 0-No pain      Patients Stated Pain Goal: 7 (16/10/96 0454)  Complications: No complications documented.

## 2020-12-13 ENCOUNTER — Encounter (HOSPITAL_COMMUNITY): Payer: Self-pay | Admitting: Ophthalmology

## 2020-12-13 ENCOUNTER — Other Ambulatory Visit (HOSPITAL_COMMUNITY): Payer: Medicare Other

## 2020-12-14 DIAGNOSIS — C44619 Basal cell carcinoma of skin of left upper limb, including shoulder: Secondary | ICD-10-CM | POA: Diagnosis not present

## 2020-12-18 ENCOUNTER — Telehealth: Payer: Self-pay | Admitting: Oncology

## 2020-12-18 NOTE — Telephone Encounter (Signed)
Received a new pt referral from Dr. Sabino Dick for basal cell carcinoma. Caleb Pugh has been cld and scheduled to see Dr Alen Blew on 5/3 at 11am. Pt aware to arrive 20 minutes early.

## 2020-12-19 ENCOUNTER — Telehealth: Payer: Self-pay | Admitting: Pharmacist

## 2020-12-19 ENCOUNTER — Other Ambulatory Visit (HOSPITAL_COMMUNITY): Payer: Self-pay

## 2020-12-19 ENCOUNTER — Inpatient Hospital Stay: Payer: Medicare Other | Attending: Oncology | Admitting: Oncology

## 2020-12-19 ENCOUNTER — Telehealth: Payer: Self-pay

## 2020-12-19 ENCOUNTER — Ambulatory Visit: Payer: Medicare Other | Admitting: Urology

## 2020-12-19 ENCOUNTER — Other Ambulatory Visit: Payer: Self-pay

## 2020-12-19 VITALS — BP 155/83 | HR 98 | Temp 98.3°F | Resp 19 | Wt 172.8 lb

## 2020-12-19 DIAGNOSIS — F17210 Nicotine dependence, cigarettes, uncomplicated: Secondary | ICD-10-CM | POA: Diagnosis not present

## 2020-12-19 DIAGNOSIS — I10 Essential (primary) hypertension: Secondary | ICD-10-CM | POA: Diagnosis not present

## 2020-12-19 DIAGNOSIS — E785 Hyperlipidemia, unspecified: Secondary | ICD-10-CM

## 2020-12-19 DIAGNOSIS — R972 Elevated prostate specific antigen [PSA]: Secondary | ICD-10-CM

## 2020-12-19 DIAGNOSIS — C44111 Basal cell carcinoma of skin of unspecified eyelid, including canthus: Secondary | ICD-10-CM | POA: Diagnosis not present

## 2020-12-19 DIAGNOSIS — E119 Type 2 diabetes mellitus without complications: Secondary | ICD-10-CM | POA: Diagnosis not present

## 2020-12-19 DIAGNOSIS — Z79899 Other long term (current) drug therapy: Secondary | ICD-10-CM | POA: Diagnosis not present

## 2020-12-19 MED ORDER — VISMODEGIB 150 MG PO CAPS
150.0000 mg | ORAL_CAPSULE | Freq: Every day | ORAL | 3 refills | Status: DC
  Filled 2020-12-19: qty 56, 56d supply, fill #0

## 2020-12-19 NOTE — Telephone Encounter (Signed)
Oral Oncology Patient Advocate Encounter  Received notification from [employer] that prior authorization for Erivedge is required.  PA submitted on CoverMyMeds Key [id number] Status is pending  Oral Oncology Clinic will continue to follow.  [place] Patient [place] Phone [phone] Fax [phone] 12/19/2020 11:28 AM

## 2020-12-19 NOTE — Telephone Encounter (Signed)
Oral Oncology Pharmacist Encounter  Received new prescription for Erivedge (vismodegib) for the treatment of locally advanced basal cell carcinoma, planned duration until disease progression or unacceptable drug toxicity.  Prescription dose and frequency assessed for appropriateness.  No baseline labs available in chart - per MD will obtain labs at next office visit and OK to proceed with patient starting therapy.    Current medication list in Epic reviewed, no relevant/significant DDIs with Erivedge identified.  Evaluated chart and no patient barriers to medication adherence noted.   Patient agreement for treatment documented in MD note on 12/19/20.  Prescription has been e-scribed to the [facility] for benefits analysis and approval.  Oral Oncology Clinic will continue to follow for insurance authorization, copayment issues, initial counseling and start date.  Leron Croak, PharmD, BCPS Hematology/Oncology Clinical Pharmacist [facility] [phone] 12/19/2020 11:29 AM

## 2020-12-19 NOTE — Addendum Note (Signed)
Addended by: Wyatt Portela on: 12/19/2020 11:30 AM   Modules accepted: Orders

## 2020-12-19 NOTE — Progress Notes (Signed)
Reason for the request:    Basal cell carcinoma  HPI: I was asked by Dr. Leonard Schwartz to evaluate Mr. Guardia for the evaluation of skin cancer.  He is a 75 year old man with a history of hypertension, hyperlipidemia and diabetes.  He was found to have a lesion of the right upper eyelid.  He was evaluated by Dr. Leonard Schwartz and found to have basal cell carcinoma.  MRI of the orbit obtained on December 01, 2020 showed a 5 x 10 mm basal cell carcinoma of the right medial canthus with no orbital extension.  Due to the location, surgery could be problematic in could result in loss of vision.  Patient was referred to me for evaluation for systemic therapy.  He does not report any headaches, blurry vision, syncope or seizures. Does not report any fevers, chills or sweats.  Does not report any cough, wheezing or hemoptysis.  Does not report any chest pain, palpitation, orthopnea or leg edema.  Does not report any nausea, vomiting or abdominal pain.  Does not report any constipation or diarrhea.  Does not report any skeletal complaints.    Does not report frequency, urgency or hematuria.  Does not report any skin rashes or lesions. Does not report any heat or cold intolerance.  Does not report any lymphadenopathy or petechiae.  Does not report any anxiety or depression.  Remaining review of systems is negative.    Past Medical History:  Diagnosis Date  . BPH (benign prostatic hyperplasia)   . Diabetes mellitus without complication ([place])   . High cholesterol   . Hypertension   :  Past Surgical History:  Procedure Laterality Date  . CATARACT EXTRACTION W/PHACO Left 12/11/2020   Procedure: CATARACT EXTRACTION PHACO AND INTRAOCULAR LENS PLACEMENT LEFT EYE;  Surgeon: Baruch Goldmann, MD;  Location: [facility];  Service: Ophthalmology;  Laterality: Left;  left CDE=13.96  . TONSILLECTOMY    :   Current Outpatient Medications:  .  ACCU-CHEK GUIDE test strip, , Disp: , Rfl:  .  alfuzosin (UROXATRAL) 10 MG 24 hr  tablet, Take 1 tablet (10 mg total) by mouth at bedtime. (Patient taking differently: Take 10 mg by mouth daily with supper.), Disp: 30 tablet, Rfl: 11 .  aspirin EC 81 MG tablet, Take 81 mg by mouth daily. Swallow whole., Disp: , Rfl:  .  doxycycline (VIBRAMYCIN) 100 MG capsule, Take 100 mg by mouth 2 (two) times daily., Disp: , Rfl:  .  famotidine (PEPCID) 20 MG tablet, Take 20 mg by mouth at bedtime as needed for heartburn or indigestion., Disp: , Rfl:  .  lisinopril-hydrochlorothiazide (ZESTORETIC) 20-12.5 MG tablet, Take 2 tablets by mouth daily., Disp: , Rfl:  .  metFORMIN (GLUCOPHAGE) 500 MG tablet, Take 500 mg by mouth daily., Disp: , Rfl:  .  naproxen sodium (ALEVE) 220 MG tablet, Take 220 mg by mouth daily as needed (pain)., Disp: , Rfl:  .  simvastatin (ZOCOR) 40 MG tablet, Take 40 mg by mouth at bedtime., Disp: , Rfl: :  No Known Allergies:  No family history on file.:  Social History   Socioeconomic History  . Marital status: Married    Spouse name: Not on file  . Number of children: 2  . Years of education: Not on file  . Highest education level: Not on file  Occupational History  . Occupation: retired  Tobacco Use  . Smoking status: Current Every Day Smoker    Packs/day: 0.50    Years: 60.00    Pack  years: 30.00    Types: Cigarettes  . Smokeless tobacco: Never Used  Vaping Use  . Vaping Use: Never used  Substance and Sexual Activity  . Alcohol use: Not on file    Comment: rare  . Drug use: Never  . Sexual activity: Not Currently  Other Topics Concern  . Not on file  Social History Narrative  . Not on file   Social Determinants of Health   Financial Resource Strain: Not on file  Food Insecurity: Not on file  Transportation Needs: Not on file  Physical Activity: Not on file  Stress: Not on file  Social Connections: Not on file  Intimate Partner Violence: Not on file  :  Pertinent items are noted in HPI.  Exam: Blood pressure (!) 155/83, pulse 98,  temperature 98.3 F (36.8 C), temperature source Tympanic, resp. rate 19, weight 172 lb 12.8 oz (78.4 kg), SpO2 100 %.  ECOG 1  General appearance: alert and cooperative appeared without distress. Head: atraumatic without any abnormalities. Eyes: conjunctivae/corneas clear. PERRL.  Sclera anicteric. Throat: lips, mucosa, and tongue normal; without oral thrush or ulcers. Resp: clear to auscultation bilaterally without rhonchi, wheezes or dullness to percussion. Cardio: regular rate and rhythm, S1, S2 normal, no murmur, click, rub or gallop GI: soft, non-tender; bowel sounds normal; no masses,  no organomegaly Skin: Pigmented lesion noted on the right upper eyelid close to his right orbit. Lymph nodes: Cervical, supraclavicular, and axillary nodes normal. Neurologic: Grossly normal without any motor, sensory or deep tendon reflexes. Musculoskeletal: No joint deformity or effusion.    MR ORBITS W WO CONTRAST  Result Date: 12/01/2020 CLINICAL DATA:  Basal cell carcinoma right medial canthus. Rule out orbital extension. EXAM: MRI OF THE ORBITS WITHOUT AND WITH CONTRAST TECHNIQUE: Multiplanar, multisequence MR imaging of the orbits was performed both before and after the administration of intravenous contrast. CONTRAST:  7.87mL GADAVIST GADOBUTROL 1 MMOL/ML IV SOLN COMPARISON:  None. FINDINGS: Orbit: Normal globe bilaterally. Normal extraocular muscles and optic nerve. Optic chiasm normal. Cavernous sinus normal bilaterally. Orbital fat normal bilaterally without infiltration. Small enhancing skin lesion right medial canthus compatible with known basal cell carcinoma. This measures approximately 5 x 10 mm. No intraorbital extension. No bony extension identified. Sinuses: Mild mucosal edema paranasal sinuses specially left frontal and ethmoid sinuses but also diffusely. Mastoid sinus clear bilaterally. Limited intracranial imaging: Mild atrophy. Patchy hyperintensity in the pons and white matter likely  due to chronic microvascular ischemia. Negative for hydrocephalus. Normal arterial flow voids at the base of brain. Skeletal: Negative skull base.  No focal skeletal lesion. IMPRESSION: 5 x 10 mm basal cell carcinoma right medial canthus. No intraorbital extension. These results were called by telephone at the time of interpretation on 12/01/2020 at 8:41 am to provider TAL RUBINSTEIN , who verbally acknowledged these results. Electronically Signed   By: Franchot Gallo M.D.   On: 12/01/2020 08:44    Assessment and Plan:   75 year old with:  1.  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  Treatment options at this time were discussed.  Surgical resection, radiation therapy versus systemic therapy options were reviewed.  Complications associated with Erivedge were discussed at this time.  These included fatigue, tiredness, GI toxicity, constipation, joint pain and muscle spasms among others.  After discussion today, he is agreeable to proceed with 150 mg dosing on a daily basis.  We will assess his response in the next 4  to 6 weeks.  Salvage radiation or surgical intervention could be reserved if he has reasonable response.  2.  Elevated PSA: He is currently under evaluation with Dr. Alyson Ingles.  He scheduled to have a prostate biopsy.  This should not interfere with intervention regarding his basal cell carcinoma.  3.  Follow-up: We will be in the next 6 weeks to evaluate his response to therapy.   60  minutes were dedicated to this visit. The time was spent on reviewing laboratory data, imaging studies, discussing treatment options, and answering questions regarding future plan.      A copy of this consult has been forwarded to the requesting physician.

## 2020-12-21 ENCOUNTER — Other Ambulatory Visit (HOSPITAL_COMMUNITY): Payer: Self-pay

## 2020-12-21 NOTE — Telephone Encounter (Signed)
Oral Oncology Patient Advocate Encounter  Prior Authorization for [facility] has been approved.    PA# [id number]  Effective dates: 12/19/20 through 08/18/21  Patients co-pay is $3041  Oral Oncology Clinic will continue to follow.    [place] Patient [place] Phone [phone] Fax [phone] 12/21/2020 1:49 PM

## 2020-12-22 ENCOUNTER — Ambulatory Visit: Payer: Medicare Other | Admitting: Urology

## 2020-12-22 DIAGNOSIS — Z299 Encounter for prophylactic measures, unspecified: Secondary | ICD-10-CM | POA: Diagnosis not present

## 2020-12-22 DIAGNOSIS — Z6824 Body mass index (BMI) 24.0-24.9, adult: Secondary | ICD-10-CM | POA: Diagnosis not present

## 2020-12-22 DIAGNOSIS — I10 Essential (primary) hypertension: Secondary | ICD-10-CM | POA: Diagnosis not present

## 2020-12-22 DIAGNOSIS — E1142 Type 2 diabetes mellitus with diabetic polyneuropathy: Secondary | ICD-10-CM | POA: Diagnosis not present

## 2020-12-22 DIAGNOSIS — R030 Elevated blood-pressure reading, without diagnosis of hypertension: Secondary | ICD-10-CM | POA: Diagnosis not present

## 2020-12-22 DIAGNOSIS — F17210 Nicotine dependence, cigarettes, uncomplicated: Secondary | ICD-10-CM | POA: Diagnosis not present

## 2020-12-22 DIAGNOSIS — E1165 Type 2 diabetes mellitus with hyperglycemia: Secondary | ICD-10-CM | POA: Diagnosis not present

## 2020-12-26 ENCOUNTER — Telehealth: Payer: Self-pay

## 2020-12-26 NOTE — Telephone Encounter (Signed)
Oral Oncology Patient Advocate Encounter  Met patient in lobby to complete application for [place] in an effort to reduce patient's out of pocket expense for Erivedge to $0.    Application completed and faxed to [phone].   [employer] patient assistance phone number for follow up is [phone].   This encounter will be updated until final determination.    [place] Patient [place] Phone [phone] Fax [phone] 12/26/2020 11:58 AM

## 2021-01-03 ENCOUNTER — Ambulatory Visit (INDEPENDENT_AMBULATORY_CARE_PROVIDER_SITE_OTHER): Payer: Medicare Other | Admitting: Urology

## 2021-01-03 ENCOUNTER — Other Ambulatory Visit: Payer: Self-pay | Admitting: Urology

## 2021-01-03 ENCOUNTER — Encounter (HOSPITAL_COMMUNITY): Payer: Self-pay

## 2021-01-03 ENCOUNTER — Other Ambulatory Visit: Payer: Self-pay

## 2021-01-03 ENCOUNTER — Ambulatory Visit (HOSPITAL_COMMUNITY)
Admit: 2021-01-03 | Discharge: 2021-01-03 | Disposition: A | Discharge status: Home / Self Care | Payer: Medicare Other | Attending: Urology | Admitting: Urology

## 2021-01-03 DIAGNOSIS — R972 Elevated prostate specific antigen [PSA]: Secondary | ICD-10-CM | POA: Diagnosis not present

## 2021-01-03 DIAGNOSIS — C61 Malignant neoplasm of prostate: Secondary | ICD-10-CM | POA: Insufficient documentation

## 2021-01-03 MED ORDER — GENTAMICIN SULFATE 40 MG/ML IJ SOLN: 80.0000 mg | Freq: Once | INTRAMUSCULAR | Status: AC

## 2021-01-03 MED ORDER — LIDOCAINE HCL (PF) 2 % IJ SOLN: 10.0000 mL | Freq: Once | INTRAMUSCULAR | Status: AC

## 2021-01-03 MED ORDER — GENTAMICIN SULFATE 40 MG/ML IJ SOLN
INTRAMUSCULAR | Status: AC
  Administered 2021-01-03: 80 mg via INTRAMUSCULAR
  Filled 2021-01-03: qty 2

## 2021-01-03 MED ORDER — LIDOCAINE HCL (PF) 2 % IJ SOLN
INTRAMUSCULAR | Status: AC
  Administered 2021-01-03: 10 mL
  Filled 2021-01-03: qty 10

## 2021-01-03 NOTE — Discharge Instructions (Signed)

## 2021-01-03 NOTE — Sedation Documentation (Signed)
PT tolerated prostate biopsy procedure and antibiotic injection well today. Labs obtained and sent for pathology. PT ambulatory at discharge with no acute distress noted and verbalized understanding of discharge instructions. PT to follow up with urologist as scheduled on 01/17/21.

## 2021-01-03 NOTE — Telephone Encounter (Signed)
Patient is approved for Erivedge at no cost from [facility] 01/03/21-until.  [place] Patient [place] Phone [phone] Fax [phone] 01/03/2021 11:51 AM

## 2021-01-08 NOTE — Telephone Encounter (Signed)
Oral Chemotherapy Pharmacist Encounter  I spoke with patient for overview of new oral chemotherapy medication: Erivedge (vismodegib) for the treatment of locally advanced basal cell carcinoma, planned duration until disease progression or unacceptable drug toxicity.  Pt is doing well. Counseled patient on administration, dosing, side effects, monitoring, drug-food interactions, safe handling, storage, and disposal.  Patient will take Erivedge 150 mg capsule, 1 capsule (150 mg total) by mouth daily. Patient may take with or without food  Erivedge start date: patient will start once received from [employer], patient calling to set up medication shipment 01/08/21  Side effects include but not limited to: muscle spasms, arthralgias, fatigue, nausea/vomiting, diarrhea, taste change, hair loss. Reviewed with patient risk of severe rash side effects - patient knows to alert office if he develops rash while on medication.     Reviewed with patient importance of keeping a medication schedule and plan for any missed doses.  After discussion with patient no patient barriers to medication adherence identified.   Mr. Wease voiced understanding and appreciation. All questions answered. Medication handout provided.  Provided patient with [facility] phone number. Patient knows to call the office with questions or concerns. Oral Chemotherapy Navigation Clinic will continue to follow.  Leron Croak, PharmD, BCPS Hematology/Oncology Clinical Pharmacist [facility] [phone] 01/08/2021 10:08 AM

## 2021-01-16 ENCOUNTER — Encounter: Payer: Self-pay | Admitting: Urology

## 2021-01-16 NOTE — Progress Notes (Signed)
Prostate Biopsy Procedure   Informed consent was obtained after discussing risks/benefits of the procedure.  A time out was performed to ensure correct patient identity.  Pre-Procedure: - Last PSA Level: No results found for: PSA - Gentamicin given prophylactically - Levaquin 500 mg administered PO -Transrectal Ultrasound performed revealing a 82.4 gm prostate -No significant hypoechoic or median lobe noted  Procedure: - Prostate block performed using 10 cc 1% lidocaine and biopsies taken from sextant areas, a total of 12 under ultrasound guidance.  Post-Procedure: - Patient tolerated the procedure well - He was counseled to seek immediate medical attention if experiences any severe pain, significant bleeding, or fevers - Return in one week to discuss biopsy results

## 2021-01-16 NOTE — Patient Instructions (Signed)

## 2021-01-17 ENCOUNTER — Other Ambulatory Visit: Payer: Self-pay

## 2021-01-17 ENCOUNTER — Encounter: Payer: Self-pay | Admitting: Urology

## 2021-01-17 ENCOUNTER — Ambulatory Visit (INDEPENDENT_AMBULATORY_CARE_PROVIDER_SITE_OTHER): Payer: Medicare Other | Admitting: Urology

## 2021-01-17 VITALS — BP 130/70 | HR 94 | Ht 72.0 in

## 2021-01-17 DIAGNOSIS — C61 Malignant neoplasm of prostate: Secondary | ICD-10-CM

## 2021-01-17 NOTE — Progress Notes (Signed)
01/17/2021 4:14 PM   Caleb Pugh Aug 08, 1946 [id number]  Referring provider: Monico Blitz, MD [address] [place],  [place] [address]  followup prostate biopsy  HPI: Caleb Pugh is a 75yo here for followup after prostate biopsy. Biopsy revealed Gleason 4+4=8 in 2 /12 cores. PSA 7.4. He has mild LUTS on uroxatral 10mg  qhs. No other complaints today   PMH: Past Medical History:  Diagnosis Date  . BPH (benign prostatic hyperplasia)   . Diabetes mellitus without complication ([place])   . High cholesterol   . Hypertension     Surgical History: Past Surgical History:  Procedure Laterality Date  . CATARACT EXTRACTION W/PHACO Left 12/11/2020   Procedure: CATARACT EXTRACTION PHACO AND INTRAOCULAR LENS PLACEMENT LEFT EYE;  Surgeon: Baruch Goldmann, MD;  Location: [facility];  Service: Ophthalmology;  Laterality: Left;  left CDE=13.96  . TONSILLECTOMY      Home Medications:  Allergies as of 01/17/2021   No Known Allergies     Medication List       Accurate as of January 17, 2021  4:14 PM. If you have any questions, ask your nurse or doctor.        Accu-Chek Guide test strip Generic drug: glucose blood   alfuzosin 10 MG 24 hr tablet Commonly known as: UROXATRAL Take 1 tablet (10 mg total) by mouth at bedtime. What changed: when to take this   aspirin EC 81 MG tablet Take 81 mg by mouth daily. Swallow whole.   doxycycline 100 MG capsule Commonly known as: VIBRAMYCIN Take 100 mg by mouth 2 (two) times daily.   famotidine 20 MG tablet Commonly known as: PEPCID Take 20 mg by mouth at bedtime as needed for heartburn or indigestion.   lisinopril-hydrochlorothiazide 20-12.5 MG tablet Commonly known as: ZESTORETIC Take 2 tablets by mouth daily.   metFORMIN 500 MG tablet Commonly known as: GLUCOPHAGE Take 500 mg by mouth daily.   naproxen sodium 220 MG tablet Commonly known as: ALEVE Take 220 mg by mouth daily as needed (pain).   simvastatin 40 MG tablet Commonly known as:  ZOCOR Take 40 mg by mouth at bedtime.   vismodegib 150 MG capsule Commonly known as: ERIVEDGE Take 1 capsule (150 mg total) by mouth daily.       Allergies: No Known Allergies  Family History: No family history on file.  Social History:  reports that he has been smoking cigarettes. He has a 30.00 pack-year smoking history. He has never used smokeless tobacco. He reports previous alcohol use. He reports that he does not use drugs.  ROS: All other review of systems were reviewed and are negative except what is noted above in HPI  Physical Exam: BP 130/70   Pulse 94   Ht 6' (1.829 m)   BMI 23.44 kg/m   Constitutional:  Alert and oriented, No acute distress. HEENT: [place] AT, moist mucus membranes.  Trachea midline, no masses. Cardiovascular: No clubbing, cyanosis, or edema. Respiratory: Normal respiratory effort, no increased work of breathing. GI: Abdomen is soft, nontender, nondistended, no abdominal masses GU: No CVA tenderness.  Lymph: No cervical or inguinal lymphadenopathy. Skin: No rashes, bruises or suspicious lesions. Neurologic: Grossly intact, no focal deficits, moving all 4 extremities. Psychiatric: Normal mood and affect.  Laboratory Data: No results found for: WBC, HGB, HCT, MCV, PLT  No results found for: CREATININE  No results found for: PSA  No results found for: TESTOSTERONE  No results found for: HGBA1C  Urinalysis    Component Value  Date/Time   APPEARANCEUR Clear 09/29/2020 1140   GLUCOSEU Negative 09/29/2020 1140   BILIRUBINUR Negative 09/29/2020 1140   PROTEINUR Negative 09/29/2020 1140   NITRITE Negative 09/29/2020 1140   LEUKOCYTESUR Negative 09/29/2020 1140    Lab Results  Component Value Date   LABMICR Comment 09/29/2020   WBCUA None seen 08/04/2020   LABEPIT None seen 08/04/2020   BACTERIA None seen 08/04/2020    Pertinent Imaging:  No results found for this or any previous visit.  No results found for this or any previous  visit.  No results found for this or any previous visit.  No results found for this or any previous visit.  No results found for this or any previous visit.  No results found for this or any previous visit.  No results found for this or any previous visit.  No results found for this or any previous visit.   Assessment & Plan:    1. Prostate cancer [facility]) I discussed the natural history of high risk prostate cancer with the patient and the various treatment options including active surveillance, RALP, IMRT, brachytherapy, cryotherapy, HIFU and ADT. After discussing the options the patient elects for radiation therapy. We will obtain CT and bone scan prior to his appointment with Dr. Tammi Klippel.    No follow-ups on file.  Nicolette Bang, MD  [facility] [place]

## 2021-01-17 NOTE — Patient Instructions (Signed)

## 2021-01-18 DIAGNOSIS — H2513 Age-related nuclear cataract, bilateral: Secondary | ICD-10-CM | POA: Diagnosis not present

## 2021-01-18 DIAGNOSIS — C441121 Basal cell carcinoma of skin of right upper eyelid, including canthus: Secondary | ICD-10-CM | POA: Diagnosis not present

## 2021-01-18 DIAGNOSIS — C441122 Basal cell carcinoma of skin of right lower eyelid, including canthus: Secondary | ICD-10-CM | POA: Diagnosis not present

## 2021-01-18 LAB — BASIC METABOLIC PANEL
BUN/Creatinine Ratio: 20 (ref 10–24)
BUN: 20 mg/dL (ref 8–27)
CO2: 26 mmol/L (ref 20–29)
Calcium: 9.6 mg/dL (ref 8.6–10.2)
Chloride: 97 mmol/L (ref 96–106)
Creatinine, Ser: 1 mg/dL (ref 0.76–1.27)
Glucose: 194 mg/dL — ABNORMAL HIGH (ref 65–99)
Potassium: 3.6 mmol/L (ref 3.5–5.2)
Sodium: 138 mmol/L (ref 134–144)
eGFR: 78 mL/min/{1.73_m2} (ref >59)

## 2021-01-24 ENCOUNTER — Telehealth: Payer: Self-pay | Admitting: Oncology

## 2021-01-24 NOTE — Telephone Encounter (Signed)
Scheduled per 05/03 los, patient has been called and notified.

## 2021-01-29 ENCOUNTER — Ambulatory Visit: Payer: Medicare Other | Admitting: Urology

## 2021-02-07 ENCOUNTER — Inpatient Hospital Stay: Payer: Medicare Other | Attending: Oncology

## 2021-02-07 ENCOUNTER — Other Ambulatory Visit: Payer: Self-pay

## 2021-02-07 ENCOUNTER — Inpatient Hospital Stay: Payer: Medicare Other | Admitting: Oncology

## 2021-02-07 VITALS — BP 112/99 | HR 101 | Temp 97.0°F | Resp 18 | Wt 173.1 lb

## 2021-02-07 DIAGNOSIS — C61 Malignant neoplasm of prostate: Secondary | ICD-10-CM | POA: Diagnosis present

## 2021-02-07 DIAGNOSIS — C441121 Basal cell carcinoma of skin of right upper eyelid, including canthus: Secondary | ICD-10-CM | POA: Insufficient documentation

## 2021-02-07 DIAGNOSIS — C441122 Basal cell carcinoma of skin of right lower eyelid, including canthus: Secondary | ICD-10-CM | POA: Diagnosis not present

## 2021-02-07 DIAGNOSIS — Z79899 Other long term (current) drug therapy: Secondary | ICD-10-CM | POA: Insufficient documentation

## 2021-02-07 DIAGNOSIS — C44111 Basal cell carcinoma of skin of unspecified eyelid, including canthus: Secondary | ICD-10-CM

## 2021-02-07 LAB — CBC WITH DIFFERENTIAL (CANCER CENTER ONLY)
Abs Immature Granulocytes: 0.06 10*3/uL (ref 0.00–0.07)
Basophils Absolute: 0 10*3/uL (ref 0.0–0.1)
Basophils Relative: 0 %
Eosinophils Absolute: 0.1 10*3/uL (ref 0.0–0.5)
Eosinophils Relative: 1 %
HCT: 41.6 % (ref 39.0–52.0)
Hemoglobin: 14.2 g/dL (ref 13.0–17.0)
Immature Granulocytes: 1 %
Lymphocytes Relative: 21 %
Lymphs Abs: 2.3 10*3/uL (ref 0.7–4.0)
MCH: 30.3 pg (ref 26.0–34.0)
MCHC: 34.1 g/dL (ref 30.0–36.0)
MCV: 88.9 fL (ref 80.0–100.0)
Monocytes Absolute: 0.6 10*3/uL (ref 0.1–1.0)
Monocytes Relative: 6 %
Neutro Abs: 8 10*3/uL — ABNORMAL HIGH (ref 1.7–7.7)
Neutrophils Relative %: 71 %
Platelet Count: 203 10*3/uL (ref 150–400)
RBC: 4.68 MIL/uL (ref 4.22–5.81)
RDW: 13 % (ref 11.5–15.5)
WBC Count: 11.2 10*3/uL — ABNORMAL HIGH (ref 4.0–10.5)
nRBC: 0 % (ref 0.0–0.2)

## 2021-02-07 LAB — CMP (CANCER CENTER ONLY)
ALT: 17 U/L (ref 0–44)
AST: 18 U/L (ref 15–41)
Albumin: 3.6 g/dL (ref 3.5–5.0)
Alkaline Phosphatase: 70 U/L (ref 38–126)
Anion gap: 11 (ref 5–15)
BUN: 22 mg/dL (ref 8–23)
CO2: 25 mmol/L (ref 22–32)
Calcium: 9.1 mg/dL (ref 8.9–10.3)
Chloride: 103 mmol/L (ref 98–111)
Creatinine: 1.12 mg/dL (ref 0.61–1.24)
GFR, Estimated: >60 mL/min (ref >60)
Glucose, Bld: 144 mg/dL — ABNORMAL HIGH (ref 70–99)
Potassium: 3.6 mmol/L (ref 3.5–5.1)
Sodium: 139 mmol/L (ref 135–145)
Total Bilirubin: 0.3 mg/dL (ref 0.3–1.2)
Total Protein: 7.4 g/dL (ref 6.5–8.1)

## 2021-02-07 NOTE — Progress Notes (Signed)
Hematology and Oncology Follow Up Visit  Caleb Pugh [id number] 02/10/1946 75 y.o. 02/07/2021 3:12 PM Monico Blitz, MDShah, Ashish, MD   Principle Diagnosis: 75 year old man with basal cell carcinoma of the right medial canthus close to the right orbit diagnosed in April 2022.  Secondary diagnosis: Prostate cancer diagnosed in May 2022.  Gleason score is 4+4 = 8 in 2 cores.  PSA is 7.4.     Current therapy: Erivedge 150 mg daily started in May 2022.  Interim History: Mr. Maret returns today for a follow-up visit.  Since the last visit, he is started [place] without any major complaints.  He completed the first month of therapy and has not reported any issues.  He denies any increase in his skin cancer.  He denies any visual changes or discomfort.  He denies any complications related to this medication.  His performance status and quality of life remains unchanged.  He continues to follow with Dr. Alyson Ingles regarding his recent diagnosis of prostate cancer.  He is scheduled to have a bone scan in the near future.     Medications: I have reviewed the patient's current medications.  Current Outpatient Medications  Medication Sig Dispense Refill   ACCU-CHEK GUIDE test strip      alfuzosin (UROXATRAL) 10 MG 24 hr tablet Take 1 tablet (10 mg total) by mouth at bedtime. (Patient taking differently: Take 10 mg by mouth daily with supper.) 30 tablet 11   aspirin EC 81 MG tablet Take 81 mg by mouth daily. Swallow whole.     doxycycline (VIBRAMYCIN) 100 MG capsule Take 100 mg by mouth 2 (two) times daily.     famotidine (PEPCID) 20 MG tablet Take 20 mg by mouth at bedtime as needed for heartburn or indigestion.     lisinopril-hydrochlorothiazide (ZESTORETIC) 20-12.5 MG tablet Take 2 tablets by mouth daily.     metFORMIN (GLUCOPHAGE) 500 MG tablet Take 500 mg by mouth daily.     naproxen sodium (ALEVE) 220 MG tablet Take 220 mg by mouth daily as needed (pain).     simvastatin (ZOCOR) 40  MG tablet Take 40 mg by mouth at bedtime.     vismodegib (ERIVEDGE) 150 MG capsule Take 1 capsule (150 mg total) by mouth daily. 30 capsule 3   No current facility-administered medications for this visit.     Allergies: No Known Allergies    Physical Exam: Blood pressure (!) 112/99, pulse (!) 101, temperature (!) 97 F (36.1 C), temperature source Tympanic, resp. rate 18, weight 173 lb 1.6 oz (78.5 kg), SpO2 98 %. ECOG: 1    General appearance: Comfortable appearing without any discomfort Head: Normocephalic without any trauma Oropharynx: Mucous membranes are moist and pink without any thrush or ulcers. Eyes: Pupils are equal and round reactive to light.  Scar tissue noted on the medial aspect of his right orbit. Lymph nodes: No cervical, supraclavicular, inguinal or axillary lymphadenopathy.   Heart:regular rate and rhythm.  S1 and S2 without leg edema. Lung: Clear without any rhonchi or wheezes.  No dullness to percussion. Abdomin: Soft, nontender, nondistended with good bowel sounds.  No hepatosplenomegaly. Musculoskeletal: No joint deformity or effusion.  Full range of motion noted. Neurological: No deficits noted on motor, sensory and deep tendon reflex exam. Skin: No petechial rash or dryness.  Appeared moist.      Lab Results: Lab Results  Component Value Date   WBC 11.2 (H) 02/07/2021   HGB 14.2 02/07/2021   HCT 41.6 02/07/2021  MCV 88.9 02/07/2021   PLT 203 02/07/2021     Chemistry      Component Value Date/Time   NA 138 01/17/2021 1630   K 3.6 01/17/2021 1630   CL 97 01/17/2021 1630   CO2 26 01/17/2021 1630   BUN 20 01/17/2021 1630   CREATININE 1.00 01/17/2021 1630      Component Value Date/Time   CALCIUM 9.6 01/17/2021 1630          Impression and Plan:  75 year old with:   1.  Basal cell carcinoma of the right medial canthus locally advanced disease noted in April 2022.   He is currently on Erivedge 150 mg daily and completed the first  month without any complications.  Risks and benefits of continuing this treatment were discussed at this time.  Surgical resection and radiation therapy remains as a salvage options if systemic therapy has not been successful.  He is agreeable to continue with this medication given his excellent tolerance at this time.  He will continue to follow with dermatology regarding his original tumor.   2.  Prostate cancer: He presented with elevated PSA of 7 and has a Gleason score 4+4 = 8.  He is completing his staging scans at this time including a bone scan.  If he has no evidence of metastatic disease I agree with a definitive treatment likely radiation based approach with androgen deprivation.   3.  Follow-up: He will return in 3 months for repeat evaluation.     30  minutes were spent on this encounter.  Time was dedicated to reviewing his cancer diagnoses, treatment choices and addressing complications related to cancer and cancer treatment.        Zola Button, MD 6/22/20223:12 PM

## 2021-02-13 NOTE — Progress Notes (Signed)
Message sent to mychart

## 2021-02-14 NOTE — Progress Notes (Signed)
GU Location of Tumor / Histology:  Adenocarcinoma of the prostate  If Prostate Cancer, Gleason Score is (4 + 4), PSA (7.4 as of 09/29/20), and Prostate volume (82.4 g)  Caleb Pugh presented ~7 months ago with signs/symptoms of: elevated PSA and moderate LUTS on BPH therapy.  CT A/P w & w/o Contrast 02/15/2021 IMPRESSION: 1. Heterogeneous enhancement of an enlarged prostate gland, nonspecific but likely related to patient's known prostate cancer. 2. No evidence of metastatic disease in the abdomen or pelvis. 3. 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 4. Stellate hyperdense foci along the right acetabulum and left femoral head without abnormal radiotracer uptake on concurrent nuclear medicine bone scan, most consistent with benign bone islands. 5.  Aortic Atherosclerosis (ICD10-I70.0).  Bone Scan Whole Body 02/15/2021 IMPRESSION: --No definite scintigraphic evidence osteoblastic metastatic disease. --Focus of abnormal radiotracer uptake along the anterior 6 right rib at the costochondral junction, incompletely evaluated on same day CT. Commonly degenerative or related to prior trauma, recommend clinical correlation. Additionally, dedicated chest CT could be utilized for further evaluation if clinically indicated.  Biopsies revealed:  01/03/2021   Past/Anticipated interventions by urology, if any:  01/03/2021 Dr. Nicolette Bang Transrectal ultrasound guided prostate biopsy   Past/Anticipated interventions by medical oncology, if any:  Under care of Dr. Zola Button 02/07/2021 Prostate cancer: --He is completing his staging scans at this time including a bone scan.  If he has no evidence of metastatic disease I agree with a  definitive treatment likely radiation based approach with androgen deprivation. 2. Basal cell carcinoma of the right medial canthus locally advanced disease noted in April 2022 --He is currently on Erivedge 150 mg daily and completed the first month without any complications --Surgical resection and radiation therapy remains as a salvage options if systemic therapy has not been successful --He is agreeable to continue with this medication given his excellent tolerance at this time.   --He will continue to follow with dermatology regarding his original tumor 3.  Follow-up: He will return in 3 months for repeat evaluation.   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 Reports regular bowel movements (taking Metamucil every other day to help keep BMs regular). States he would have mixed feelings if he had to live with his current urinary condition for the rest of his life  Nausea/Vomiting, if any: Patient denies  Pain issues, if any:  Patient denies   SAFETY ISSUES: Prior radiation? No Pacemaker/ICD? No Possible current pregnancy? N/A Is the patient on methotrexate? No  Current Complaints / other details:  Patient has received the first 2 Pf[employer] vaccines

## 2021-02-15 ENCOUNTER — Ambulatory Visit (HOSPITAL_COMMUNITY)
Admission: RE | Admit: 2021-02-15 | Discharge: 2021-02-15 | Disposition: A | Discharge status: Home / Self Care | Payer: Medicare Other | Source: Ambulatory Visit | Attending: Urology | Admitting: Urology

## 2021-02-15 ENCOUNTER — Encounter (HOSPITAL_COMMUNITY): Payer: Self-pay

## 2021-02-15 ENCOUNTER — Other Ambulatory Visit: Payer: Self-pay

## 2021-02-15 DIAGNOSIS — K7689 Other specified diseases of liver: Secondary | ICD-10-CM | POA: Diagnosis not present

## 2021-02-15 DIAGNOSIS — C61 Malignant neoplasm of prostate: Secondary | ICD-10-CM | POA: Insufficient documentation

## 2021-02-15 DIAGNOSIS — R350 Frequency of micturition: Secondary | ICD-10-CM | POA: Diagnosis not present

## 2021-02-15 DIAGNOSIS — I10 Essential (primary) hypertension: Secondary | ICD-10-CM | POA: Diagnosis not present

## 2021-02-15 DIAGNOSIS — K219 Gastro-esophageal reflux disease without esophagitis: Secondary | ICD-10-CM | POA: Diagnosis not present

## 2021-02-15 HISTORY — DX: Malignant (primary) neoplasm, unspecified: C80.1

## 2021-02-15 IMAGING — CT CT ABD-PEL WO/W CM
3 of 9 series · 10 of 46 positions shown, 16 images · IV contrast (omnipaque)
Comparison: Same day nuclear medicine bone scan.

CLINICAL DATA: Recently diagnosed prostate cancer, staging. History
of frequent urination difficulty emptying bladder.

EXAM:
CT ABDOMEN AND PELVIS WITHOUT AND WITH CONTRAST
TECHNIQUE: Multidetector CT imaging of the abdomen and pelvis was performed
following the standard protocol before and following the bolus
administration of intravenous contrast.
CONTRAST:  100mL OMNIPAQUE IOHEXOL 300 MG/ML  SOLN

[Series 3: axial st · axial · 0.84mm/px · z∈[-870,-785]mm · 2 of 100 slices shown]
[im 17/100  soft-tissue]
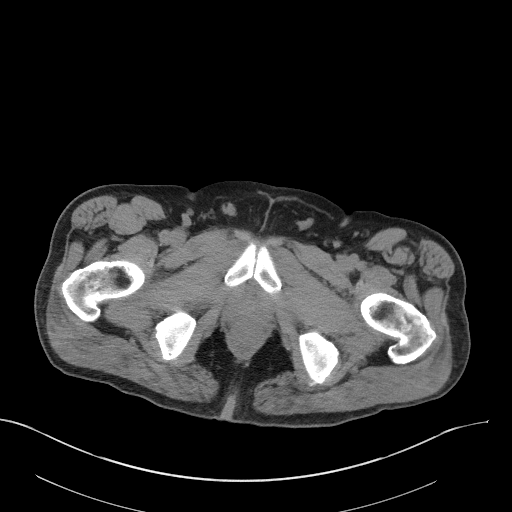
[im 34/100  soft-tissue]
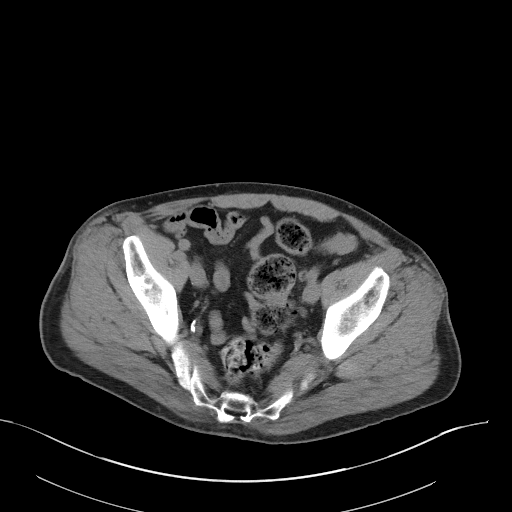

[Series 4: coronal st · coronal · 0.82mm/px · 3 of 103 slices shown, 4 images]
[im 26/103  soft-tissue]
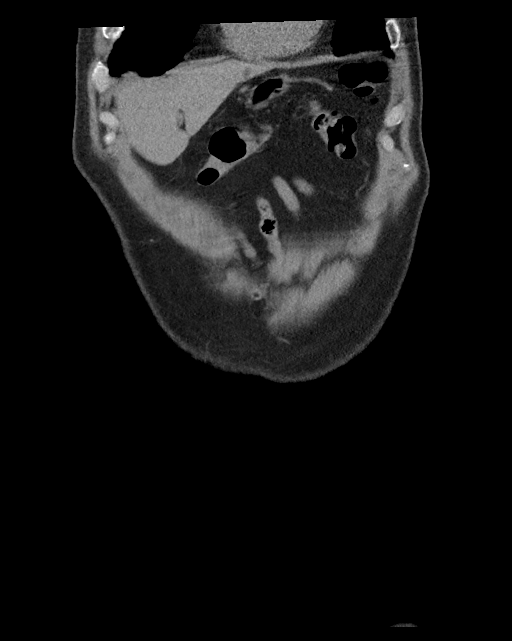
[im 52/103  soft-tissue]
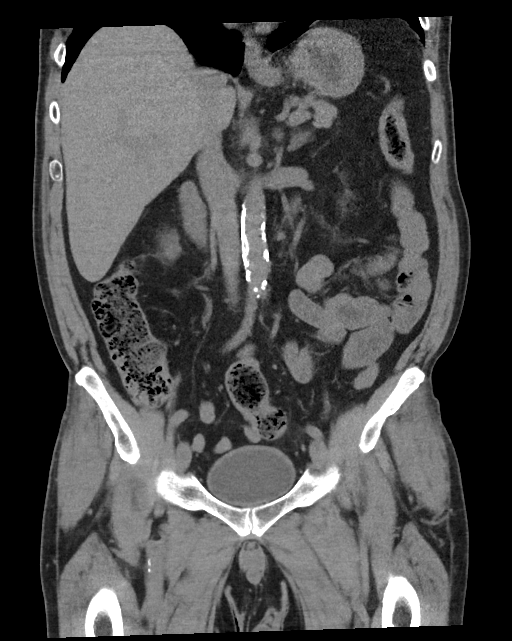
[im 52/103  bone]
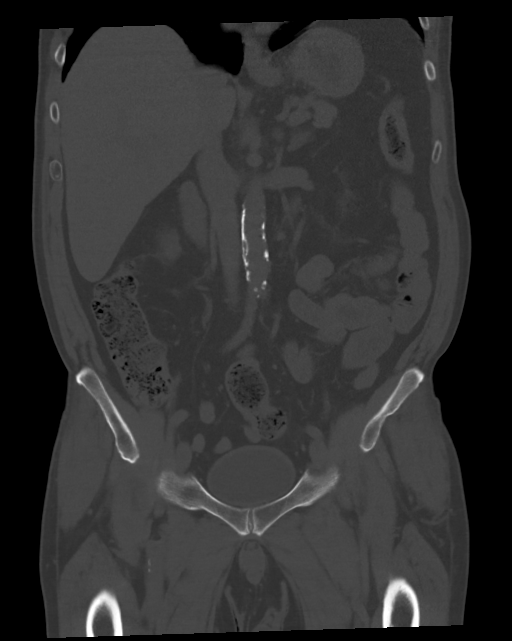
[im 77/103  soft-tissue]
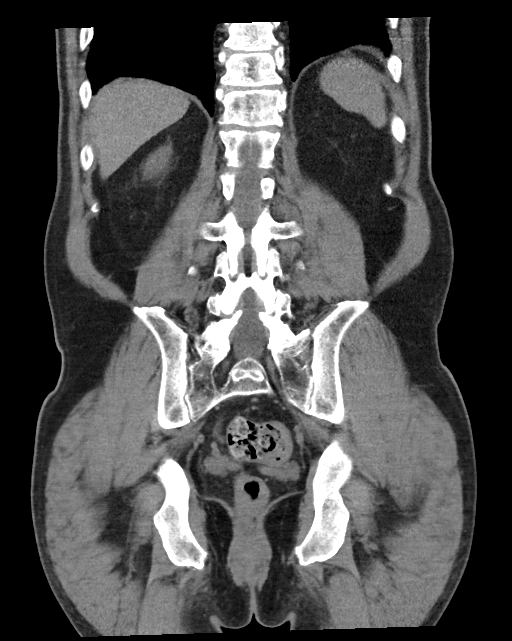

[Series 12: delays · axial · 0.84mm/px · z∈[-899,-529]mm · 5 of 112 slices shown, 10 images]
[im 19/112  soft-tissue]
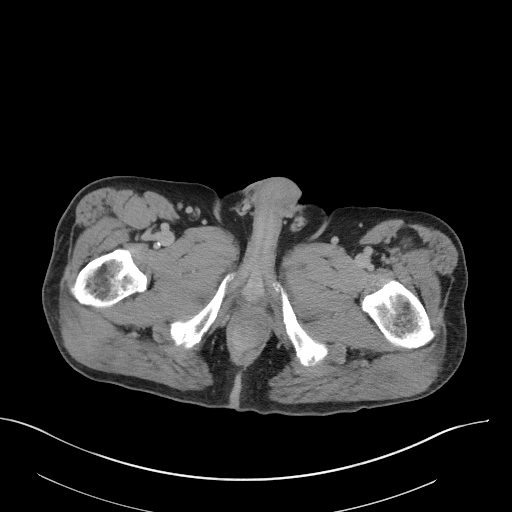
[im 19/112  bone]
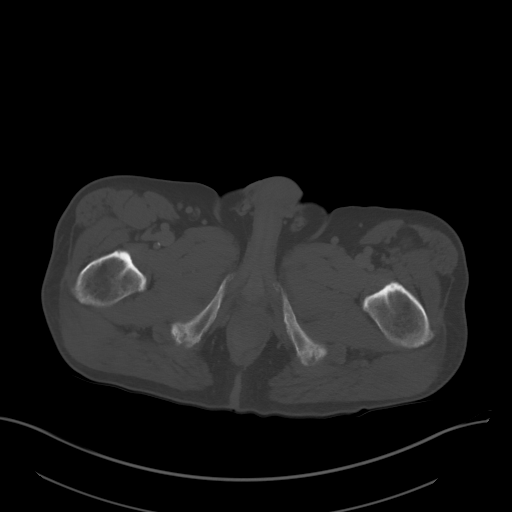
[im 38/112  soft-tissue]
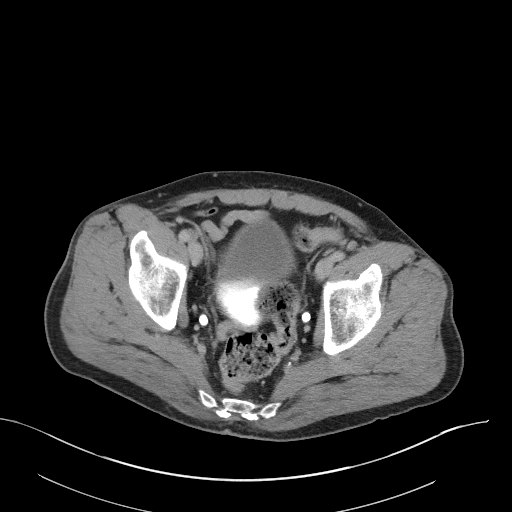
[im 38/112  lung]
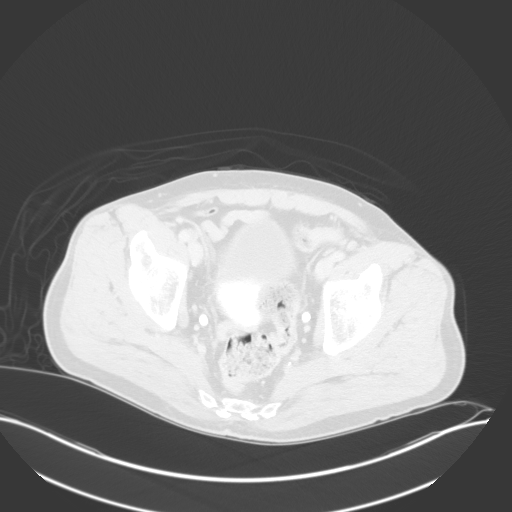
[im 56/112  soft-tissue]
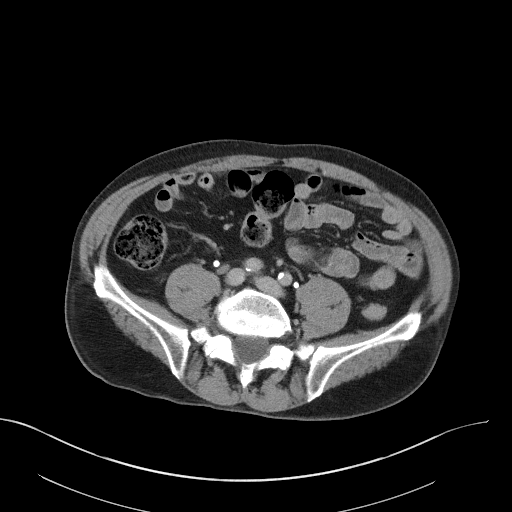
[im 56/112  lung]
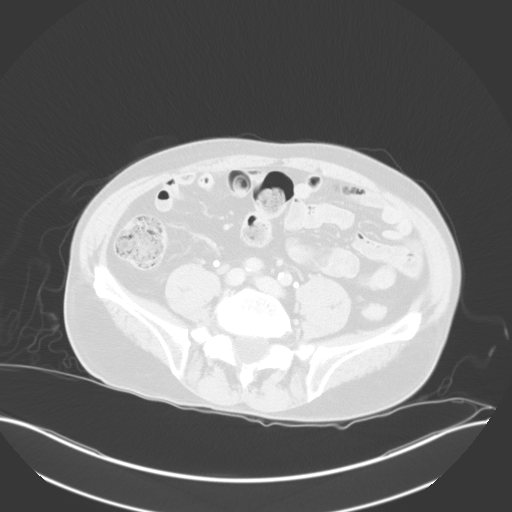
[im 75/112  soft-tissue]
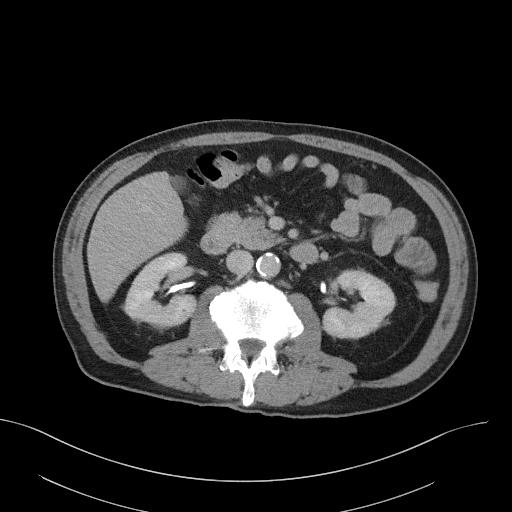
[im 75/112  lung]
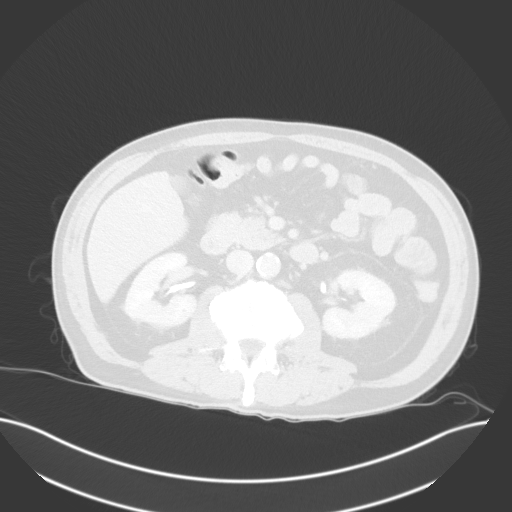
[im 93/112  soft-tissue]
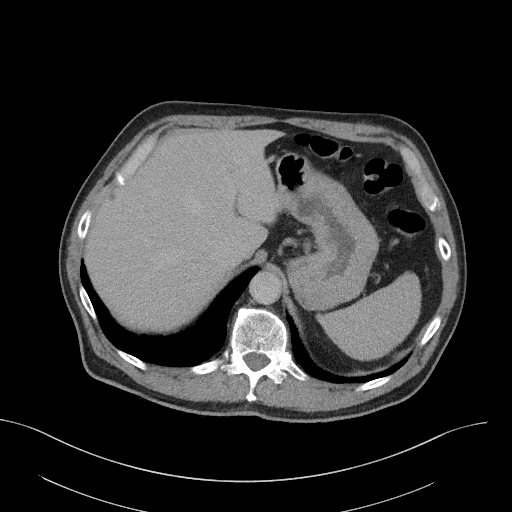
[im 93/112  lung]
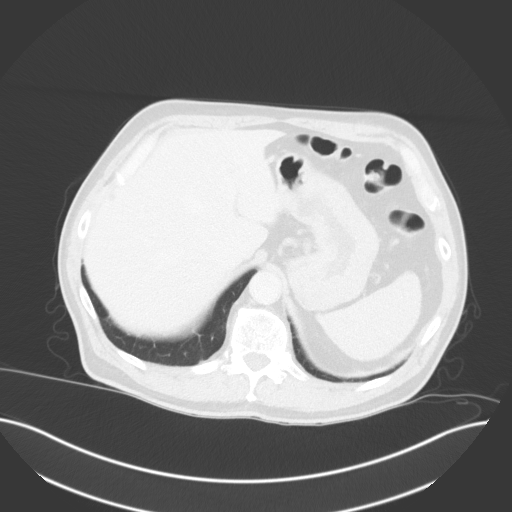

[10 of 46 positions shown; findings below may reference images not displayed]

FINDINGS: Lower chest: Scarring in the right lung base. No acute abnormality.
Normal size heart. No significant pericardial effusion/thickening.

Hepatobiliary: Segment VI hepatic lesion which is ill-defined in
hypodense on noncontrast sequence, demonstrates a nodular focus of
enhancement similar blood pool on the portal venous postcontrast
phase measuring 2.2 cm on image [DATE], and equal rates a background
liver on delayed imaging most consistent with a benign hepatic
hemangioma. Septated 1.8 cm nonenhancing hypodense lesion in the
caudate lobe of the liver on image [DATE], consistent with a hepatic
cyst. Additional hypodense hepatic lesions measuring less than 1 cm,
too small to accurately characterize but favored to represent a
benign etiology such as hepatic cysts. Pharyngeal cap on the
gallbladder. Gallbladder is otherwise predominantly decompressed and
unremarkable. No biliary ductal dilation.

Pancreas: Within normal limits.

Spleen: Within normal limits.

Adrenals/Urinary Tract: Thickening of the bilateral adrenal glands,
which is slightly nodular on the left but without a dominant nodule,
favored represent hyperplasia.

No hydronephrosis. No renal, ureteral or bladder calculi visualized.
Hypodense bilateral renal lesions which are technically too small to
accurately characterize but favored represent cysts. Symmetric
enhancement and excretion of contrast in the bilateral kidneys. No
suspicious filling defect visualized within the opacified portions
of the collecting system or ureters on delayed imaging.

Urinary bladder is effaced by enlarged prostate without focal wall
thickening or suspicious intraluminal filling defect visualized.

Stomach/Bowel: Stomach is grossly unremarkable. No pathologic
dilation of small bowel. The appendix and terminal ileum are grossly
unremarkable. Portions of the colon are decompressed limiting
evaluation. No suspicious colonic wall thickening or mass like
lesions visualized.

Vascular/Lymphatic: Aortic and branch vessel atherosclerosis without
aneurysmal dilation. No pathologically enlarged abdominal or pelvic
lymph nodes visualized.

Reproductive: Heterogeneous enhancement of an enlarged prostate.

Other: No abdominopelvic ascites.

Musculoskeletal: Tiny stellate hyperdense foci along the right
acetabulum and left femoral head without abnormal radiotracer uptake
on concurrent nuclear medicine bone scan, most consistent with
benign bone islands. Multilevel degenerative changes spine.
Degenerative change of the hips and SI joints. No aggressive lytic
or blastic lesion of bone.
IMPRESSION: 1. Heterogeneous enhancement of an enlarged prostate gland,
nonspecific but likely related to patient's known prostate cancer.
2. No evidence of metastatic disease in the abdomen or pelvis.
3. Segment VI hepatic lesion which is ill-defined and hypodense on
noncontrast sequence, with a nodular focus of enhancement similar
blood pool on the portal venous postcontrast phase and equal grading
to background liver on delayed phase, most consistent with a benign
hepatic hemangioma.
4. Stellate hyperdense foci along the right acetabulum and left
femoral head without abnormal radiotracer uptake on concurrent
nuclear medicine bone scan, most consistent with benign bone
islands.
5.  Aortic Atherosclerosis ([id number]).

## 2021-02-15 IMAGING — NM NM BONE WHOLE BODY
4 series · 4 of 4 positions shown · non-contrast
Comparison: Same day CT abdomen pelvis

CLINICAL DATA: Newly diagnosed prostate cancer, staging

EXAM:
NUCLEAR MEDICINE WHOLE BODY BONE SCAN
TECHNIQUE: Whole body anterior and posterior images were obtained approximately
3 hours after intravenous injection of radiopharmaceutical.
RADIOPHARMACEUTICALS:  22 mCi Eechnetium-NNm MDP IV

[Series 1: wbr_bone_40 whole body · 2.66mm/px · 1 of 1 slices shown (1 of 2)]
[im 1/1]
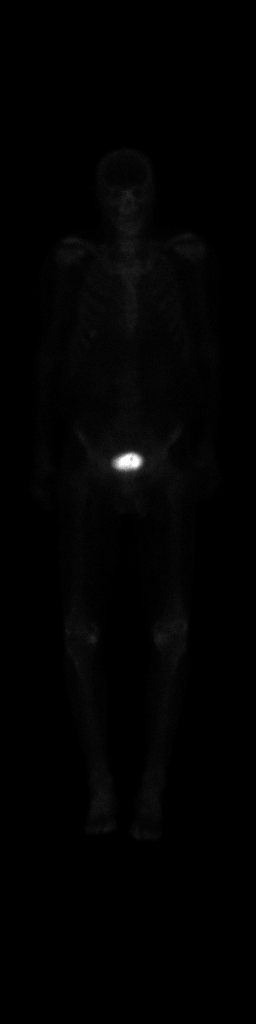

[Series 1: whole body · 2.66mm/px · 1 of 1 slices shown (1 of 2)]
[im 1/1]
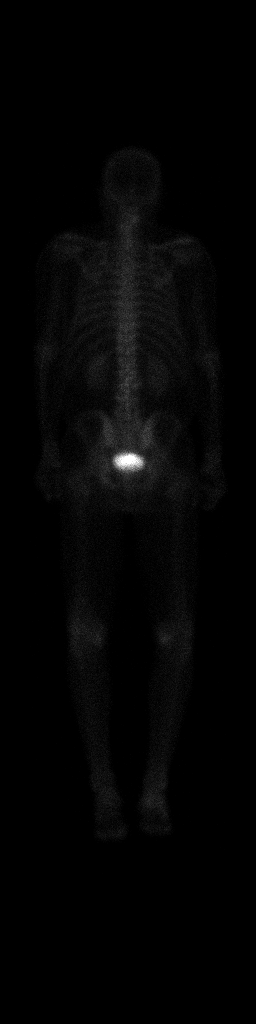

[Series 1: wbr_bone_40 whole body · 2.66mm/px · 1 of 1 slices shown (2 of 2)]
[im 1/1]
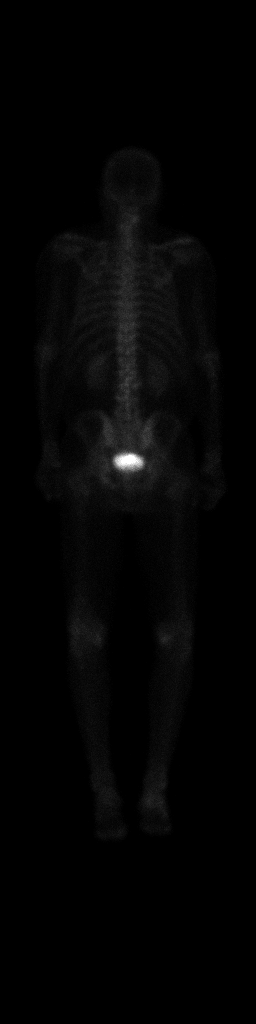

[Series 1: whole body · 2.66mm/px · 1 of 1 slices shown (2 of 2)]
[im 1/1]
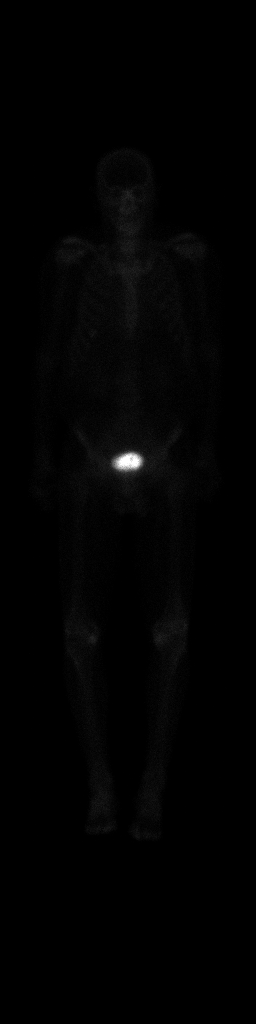

[4 of 4 positions shown; findings below may reference images not displayed]

FINDINGS: No definite scintigraphic evidence of abnormal radiotracer uptake to
suggest osteoblastic metastatic disease.

Focus of abnormal radiotracer uptake along the anterior right sixth
rib at costochondral junction, with this region largely excluded
from the field of view on same day CT, recommend correlation with
history of trauma.

Multifocal radiotracer uptake involving the shoulders, elbows,
wrists, hands, spine, hips, knees, ankles and feet in a pattern most
consistent with degenerative arthropathy.

Focus of radiotracer uptake along the anterior right mandible,
commonly related to odontogenic disease.

Otherwise physiologic distribution of radiotracer activity.
IMPRESSION: No definite scintigraphic evidence osteoblastic metastatic disease.

Focus of abnormal radiotracer uptake along the anterior 6 right rib
at the costochondral junction, incompletely evaluated on same day
CT. Commonly degenerative or related to prior trauma, recommend
clinical correlation. Additionally, dedicated chest CT could be
utilized for further evaluation if clinically indicated.

## 2021-02-15 MED ORDER — IOHEXOL 300 MG/ML  SOLN
100.0000 mL | Freq: Once | INTRAMUSCULAR | Status: AC | PRN
  Administered 2021-02-15: 100 mL via INTRAVENOUS

## 2021-02-15 MED ORDER — TECHNETIUM TC 99M MEDRONATE IV KIT
20.0000 | PACK | Freq: Once | INTRAVENOUS | Status: AC | PRN
  Administered 2021-02-15: 22 via INTRAVENOUS

## 2021-02-26 DIAGNOSIS — C61 Malignant neoplasm of prostate: Secondary | ICD-10-CM | POA: Insufficient documentation

## 2021-02-26 NOTE — Progress Notes (Signed)
Radiation Oncology         [phone]) [phone] ________________________________  Initial Outpatient Consultation  Name: Caleb Pugh MRN: [id number]  Date: 02/27/2021  DOB: 02/08/1946  UD:JSHF, Weldon Picking, MD  McKenzie, Candee Furbish, MD   REFERRING PHYSICIAN: Cleon Gustin, MD  DIAGNOSIS: 75 y.o. gentleman with Stage T1c adenocarcinoma of the prostate with Gleason score of 4+4, and PSA of 7.4.    ICD-10-CM   1. Malignant neoplasm of prostate ([place])  C61       HISTORY OF PRESENT ILLNESS: Caleb Pugh is a 75 y.o. male with a diagnosis of prostate cancer. He was noted to have an elevated PSA of 6.3 in October 2021 by his primary care physician, Dr. Monico Blitz.  Accordingly, he was referred for evaluation in urology by Dr. Alyson Ingles on 08/04/20,  digital rectal examination was performed at that time revealing no nodules.  The patient proceeded to transrectal ultrasound with 12 biopsies of the prostate on 01/03/21.  The prostate volume measured 82.4 cc.  Out of 12 core biopsies, 2 were positive.  The maximum Gleason score was 4+4, and this was seen in the right base and right lateral base.   Staging CT abd/pelvis and bone scan were done on 02/15/21 showing no clear metastatic disease.  There was some isolated rib uptake potentially warranting chest CT pending clinical correlation.  The patient reviewed the biopsy results with his urologist and he has kindly been referred today for discussion of potential radiation treatment options.   PREVIOUS RADIATION THERAPY: No  PAST MEDICAL HISTORY:  Past Medical History:  Diagnosis Date   BPH (benign prostatic hyperplasia)    Cancer ([place])    Diabetes mellitus without complication ([place])    High cholesterol    Hypertension       PAST SURGICAL HISTORY: Past Surgical History:  Procedure Laterality Date   CATARACT EXTRACTION W/PHACO Left 12/11/2020   Procedure: CATARACT EXTRACTION PHACO AND INTRAOCULAR LENS PLACEMENT LEFT EYE;  Surgeon:  Baruch Goldmann, MD;  Location: [facility];  Service: Ophthalmology;  Laterality: Left;  left CDE=13.96   TONSILLECTOMY      FAMILY HISTORY: No family history on file.  SOCIAL HISTORY:  Social History   Socioeconomic History   Marital status: Married    Spouse name: Not on file   Number of children: 2   Years of education: Not on file   Highest education level: Not on file  Occupational History   Occupation: retired  Tobacco Use   Smoking status: Every Day    Packs/day: 0.50    Years: 60.00    Pack years: 30.00    Types: Cigarettes   Smokeless tobacco: Never  Vaping Use   Vaping Use: Never used  Substance and Sexual Activity   Alcohol use: Not Currently    Comment: rare   Drug use: Never   Sexual activity: Not Currently  Other Topics Concern   Not on file  Social History Narrative   Not on file   Social Determinants of Health   Financial Resource Strain: Not on file  Food Insecurity: Not on file  Transportation Needs: Not on file  Physical Activity: Not on file  Stress: Not on file  Social Connections: Not on file  Intimate Partner Violence: Not on file    ALLERGIES: Patient has no known allergies.  MEDICATIONS:  Current Outpatient Medications  Medication Sig Dispense Refill   ACCU-CHEK GUIDE test strip      alfuzosin (UROXATRAL) 10 MG 24 hr  tablet Take 1 tablet (10 mg total) by mouth at bedtime. (Patient taking differently: Take 10 mg by mouth daily with supper.) 30 tablet 11   aspirin EC 81 MG tablet Take 81 mg by mouth daily. Swallow whole.     doxycycline (VIBRAMYCIN) 100 MG capsule Take 100 mg by mouth 2 (two) times daily.     famotidine (PEPCID) 20 MG tablet Take 20 mg by mouth at bedtime as needed for heartburn or indigestion.     lisinopril-hydrochlorothiazide (ZESTORETIC) 20-12.5 MG tablet Take 2 tablets by mouth daily.     metFORMIN (GLUCOPHAGE) 500 MG tablet Take 500 mg by mouth daily.     naproxen sodium (ALEVE) 220 MG tablet Take 220 mg by mouth  daily as needed (pain).     simvastatin (ZOCOR) 40 MG tablet Take 40 mg by mouth at bedtime.     vismodegib (ERIVEDGE) 150 MG capsule Take 1 capsule (150 mg total) by mouth daily. 30 capsule 3   No current facility-administered medications for this encounter.    REVIEW OF SYSTEMS:  On review of systems, the patient reports that he is doing well overall. He denies any chest pain, shortness of breath, cough, fevers, chills, night sweats, unintended weight changes. He denies any bowel disturbances, and denies abdominal pain, nausea or vomiting. He denies any new musculoskeletal or joint aches or pains. His IPSS was 6, indicating mild urinary symptoms. His SHIM was 16, indicating he does have mild to moderate erectile dysfunction. A complete review of systems is obtained and is otherwise negative.    PHYSICAL EXAM:  Wt Readings from Last 3 Encounters:  02/07/21 173 lb 1.6 oz (78.5 kg)  12/19/20 172 lb 12.8 oz (78.4 kg)  12/06/20 168 lb (76.2 kg)   Temp Readings from Last 3 Encounters:  02/07/21 (!) 97 F (36.1 C) (Tympanic)  01/03/21 97.8 F (36.6 C) (Oral)  12/19/20 98.3 F (36.8 C) (Tympanic)   BP Readings from Last 3 Encounters:  02/07/21 (!) 112/99  01/17/21 130/70  01/03/21 (!) 169/90   Pulse Readings from Last 3 Encounters:  02/07/21 (!) 101  01/17/21 94  01/03/21 90    /10  In general this is a well appearing man in no acute distress. He's alert and oriented x4 and appropriate throughout the examination. Cardiopulmonary assessment is negative for acute distress, and he exhibits normal effort.     KPS = 100  100 - Normal; no complaints; no evidence of disease. 90   - Able to carry on normal activity; minor signs or symptoms of disease. 80   - Normal activity with effort; some signs or symptoms of disease. 72   - Cares for self; unable to carry on normal activity or to do active work. 60   - Requires occasional assistance, but is able to care for most of his personal  needs. 50   - Requires considerable assistance and frequent medical care. 25   - Disabled; requires special care and assistance. 64   - Severely disabled; hospital admission is indicated although death not imminent. 34   - Very sick; hospital admission necessary; active supportive treatment necessary. 10   - Moribund; fatal processes progressing rapidly. 0     - Dead  Karnofsky DA, Abelmann WH, Craver LS and Burchenal [facility] [phone]) The use of the nitrogen mustards in the palliative treatment of carcinoma: with particular reference to bronchogenic carcinoma Cancer 1 634-56  LABORATORY DATA:  Lab Results  Component Value Date   WBC 11.2 (  H) 02/07/2021   HGB 14.2 02/07/2021   HCT 41.6 02/07/2021   MCV 88.9 02/07/2021   PLT 203 02/07/2021   Lab Results  Component Value Date   NA 139 02/07/2021   K 3.6 02/07/2021   CL 103 02/07/2021   CO2 25 02/07/2021   Lab Results  Component Value Date   ALT 17 02/07/2021   AST 18 02/07/2021   ALKPHOS 70 02/07/2021   BILITOT 0.3 02/07/2021     RADIOGRAPHY: CT Abdomen Pelvis W Wo Contrast  Result Date: 02/16/2021 CLINICAL DATA:  Recently diagnosed prostate cancer, staging. History of frequent urination difficulty emptying bladder. EXAM: CT ABDOMEN AND PELVIS WITHOUT AND WITH CONTRAST TECHNIQUE: Multidetector CT imaging of the abdomen and pelvis was performed following the standard protocol before and following the bolus administration of intravenous contrast. CONTRAST:  164mL OMNIPAQUE IOHEXOL 300 MG/ML  SOLN COMPARISON:  Same day nuclear medicine bone scan. FINDINGS: Lower chest: Scarring in the right lung base. No acute abnormality. Normal size heart. No significant pericardial effusion/thickening. 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 Septated 1.8 cm nonenhancing hypodense lesion in the caudate lobe of the liver on image 17/7, consistent with a hepatic cyst. Additional hypodense hepatic lesions measuring less than 1 cm, too small to accurately characterize but favored to represent a benign etiology such as hepatic cysts. Pharyngeal cap on the gallbladder. Gallbladder is otherwise predominantly decompressed and unremarkable. No biliary ductal dilation. Pancreas: Within normal limits. Spleen: Within normal limits. Adrenals/Urinary Tract: Thickening of the bilateral adrenal glands, which is slightly nodular on the left but without a dominant nodule, favored represent hyperplasia. No hydronephrosis. No renal, ureteral or bladder calculi visualized. Hypodense bilateral renal lesions which are technically too small to accurately characterize but favored represent cysts. Symmetric enhancement and excretion of contrast in the bilateral kidneys. No suspicious filling defect visualized within the opacified portions of the collecting system or ureters on delayed imaging. Urinary bladder is effaced by enlarged prostate without focal wall thickening or suspicious intraluminal filling defect visualized. Stomach/Bowel: Stomach is grossly unremarkable. No pathologic dilation of small bowel. The appendix and terminal ileum are grossly unremarkable. Portions of the colon are decompressed limiting evaluation. No suspicious colonic wall thickening or mass like lesions visualized. Vascular/Lymphatic: Aortic and branch vessel atherosclerosis without aneurysmal dilation. No pathologically enlarged abdominal or pelvic lymph nodes visualized. Reproductive: Heterogeneous enhancement of an enlarged prostate. Other: No abdominopelvic ascites. Musculoskeletal: Tiny stellate hyperdense foci along the right acetabulum and left femoral head without abnormal radiotracer uptake on concurrent  nuclear medicine bone scan, most consistent with benign bone islands. Multilevel degenerative changes spine. Degenerative change of the hips and SI joints. No aggressive lytic or blastic lesion of bone. IMPRESSION: 1. Heterogeneous enhancement of an enlarged prostate gland, nonspecific but likely related to patient's known prostate cancer. 2. No evidence of metastatic disease in the abdomen or pelvis. 3. 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 4. Stellate hyperdense foci along the right acetabulum and left femoral head without abnormal radiotracer uptake on concurrent nuclear medicine  bone scan, most consistent with benign bone islands. 5.  Aortic Atherosclerosis (ICD10-I70.0). Electronically Signed   By: Dahlia Bailiff MD   On: 02/16/2021 08:34   NM Bone Scan Whole Body  Result Date: 02/16/2021 CLINICAL DATA:  Newly diagnosed prostate cancer, staging EXAM: NUCLEAR MEDICINE WHOLE BODY BONE SCAN TECHNIQUE: Whole body anterior and posterior images were obtained approximately 3 hours after intravenous injection of radiopharmaceutical. RADIOPHARMACEUTICALS:  22 mCi Technetium-55m MDP IV COMPARISON:  Same day CT abdomen pelvis FINDINGS: No definite scintigraphic evidence of abnormal radiotracer uptake to suggest osteoblastic metastatic disease. Focus of abnormal radiotracer uptake along the anterior right sixth rib at costochondral junction, with this region largely excluded from the field of view on same day CT, recommend correlation with history of trauma. Multifocal radiotracer uptake involving the shoulders, elbows, wrists, hands, spine, hips, knees, ankles and feet in a pattern most consistent with degenerative arthropathy. Focus of radiotracer uptake along the anterior  right mandible, commonly related to odontogenic disease. Otherwise physiologic distribution of radiotracer activity. IMPRESSION: No definite scintigraphic evidence osteoblastic metastatic disease. Focus of abnormal radiotracer uptake along the anterior 6 right rib at the costochondral junction, incompletely evaluated on same day CT. Commonly degenerative or related to prior trauma, recommend clinical correlation. Additionally, dedicated chest CT could be utilized for further evaluation if clinically indicated. Electronically Signed   By: Dahlia Bailiff MD   On: 02/16/2021 08:40      IMPRESSION/PLAN: 1. 75 y.o. gentleman with Stage T1c adenocarcinoma of the prostate with Gleason score of 4+4, and PSA of 7.4.  We discussed the patient's workup and outlined the nature of prostate cancer in this setting. The patient's T stage, Gleason's score, and PSA put him into the high risk group. Accordingly, he is eligible for a variety of potential treatment options including ADT in combination with 8 weeks of external radiation or ADT with 5 weeks of external radiation with an upfront brachytherapy boost. We discussed the available radiation techniques, and focused on the details and logistics of delivery. The patient may not be an ideal candidate for brachytherapy boost with a prostate volume of 82.4 prior to downsizing from hormone therapy. We discussed that based on his prostate volume, he would require at least 3 months of ADT to allow for downsizing of the prostate prior to initiating radiotherapy. We discussed and outlined the risks, benefits, short and long-term effects associated with radiotherapy and compared and contrasted these with prostatectomy. We discussed the role of SpaceOAR gel in reducing the rectal toxicity associated with radiotherapy. We also detailed the role of ADT in the treatment of high risk prostate cancer and outlined the associated side effects that could be expected with this therapy.  He  appears to have a good understanding of his disease and our treatment recommendations which are of curative intent.  He was encouraged to ask questions that were answered to his stated satisfaction.  At the conclusion of our conversation, the patient is interested in moving forward with LT-ADT and IMRT in [place] at [facility].  I'll defer to Dr. Alyson Ingles about additional imaging of the rib post bone scan, but, the patient does describe several rib traumas over the years.  I personally spent 60 minutes in this encounter including chart review, reviewing radiological studies, meeting face-to-face with the patient, entering orders and completing documentation.      Tyler Pita, MD  [facility]  Radiation Oncology Direct Dial: [phone]  Fax: [phone] .com  Skype  LinkedIn

## 2021-02-27 ENCOUNTER — Ambulatory Visit
Admission: RE | Admit: 2021-02-27 | Discharge: 2021-02-27 | Disposition: A | Discharge status: Home / Self Care | Payer: Medicare Other | Source: Ambulatory Visit | Attending: Radiation Oncology | Admitting: Radiation Oncology

## 2021-02-27 ENCOUNTER — Telehealth: Payer: Self-pay | Admitting: *Deleted

## 2021-02-27 ENCOUNTER — Other Ambulatory Visit: Payer: Self-pay

## 2021-02-27 VITALS — BP 159/77 | HR 79 | Temp 97.1°F | Resp 18 | Ht 72.0 in | Wt 173.2 lb

## 2021-02-27 DIAGNOSIS — C61 Malignant neoplasm of prostate: Secondary | ICD-10-CM

## 2021-02-27 NOTE — Telephone Encounter (Signed)
XXXXX

## 2021-02-28 ENCOUNTER — Ambulatory Visit: Payer: Medicare Other | Admitting: Urology

## 2021-03-02 ENCOUNTER — Other Ambulatory Visit: Payer: Self-pay

## 2021-03-02 ENCOUNTER — Ambulatory Visit (INDEPENDENT_AMBULATORY_CARE_PROVIDER_SITE_OTHER): Payer: Medicare Other | Admitting: Urology

## 2021-03-02 ENCOUNTER — Encounter: Payer: Self-pay | Admitting: Urology

## 2021-03-02 VITALS — BP 148/69 | HR 89

## 2021-03-02 DIAGNOSIS — C61 Malignant neoplasm of prostate: Secondary | ICD-10-CM

## 2021-03-02 LAB — MICROSCOPIC EXAMINATION
Bacteria, UA: NONE SEEN
Epithelial Cells (non renal): NONE SEEN /hpf (ref 0–10)
Renal Epithel, UA: NONE SEEN /hpf
WBC, UA: NONE SEEN /hpf (ref 0–5)

## 2021-03-02 LAB — URINALYSIS, ROUTINE W REFLEX MICROSCOPIC
Bilirubin, UA: NEGATIVE
Glucose, UA: NEGATIVE
Ketones, UA: NEGATIVE
Leukocytes,UA: NEGATIVE
Nitrite, UA: NEGATIVE
Protein,UA: NEGATIVE
Specific Gravity, UA: 1.015 (ref 1.005–1.030)
Urobilinogen, Ur: 0.2 mg/dL (ref 0.2–1.0)
pH, UA: 6.5 (ref 5.0–7.5)

## 2021-03-02 MED ORDER — DEGARELIX ACETATE(240 MG DOSE) 120 MG/VIAL ~~LOC~~ SOLR
240.0000 mg | Freq: Once | SUBCUTANEOUS | Status: AC
  Administered 2021-03-02: 240 mg via SUBCUTANEOUS

## 2021-03-02 NOTE — Progress Notes (Signed)
Urological Symptom Review  Patient is experiencing the following symptoms: Get up at night to urinate   Review of Systems  Gastrointestinal (upper)  : Negative for upper GI symptoms  Gastrointestinal (lower) : Negative for lower GI symptoms  Constitutional : Negative for symptoms  Skin: Negative for skin symptoms  Eyes: Negative for eye symptoms  Ear/Nose/Throat : Negative for Ear/Nose/Throat symptoms  Hematologic/Lymphatic: Negative for Hematologic/Lymphatic symptoms  Cardiovascular : Negative for cardiovascular symptoms  Respiratory : Negative for respiratory symptoms  Endocrine: Negative for endocrine symptoms  Musculoskeletal: Negative for musculoskeletal symptoms  Neurological: Negative for neurological symptoms  Psychologic: Negative for psychiatric symptoms   Firmagon Sub Q Injection  Due to Prostate Cancer patient is present today for a Firmagon Injection.   Medication: Mills Koller (Degarelix)  Dose: 240mg  Location: right & upper abdomen Lot: [id number] Exp: 11/2022  Patient tolerated well, no complications were noted  Performed by: Keanu Frickey LPN   Follow up: Per MD note

## 2021-03-02 NOTE — Patient Instructions (Signed)
Prostate Cancer  The prostate is a small gland (1.5 inches [3.8 cm] wide and 1 inch [2.5 cm] high) that is involved in the production of semen. It is located below a man's bladder, in front of the rectum. Prostate cancer is the abnormal growth ofcells in the prostate gland. What are the causes? The exact cause of this condition is not known. What increases the risk? You are more likely to develop this condition if: You are 76 years of age or older. You are African American. You have a family history of prostate cancer. You have a family history of breast cancer. What are the signs or symptoms? Symptoms of this condition include: A need to urinate often. Weak or interrupted flow of urine. Trouble starting or stopping urination. Inability to urinate. Blood in urine or semen. Persistent pain or discomfort in the lower back, lower abdomen, hips, or upper thighs. Trouble getting an erection. Trouble emptying the bladder all the way. How is this diagnosed? This condition can be diagnosed with: A digital rectal exam. For this exam, a health care provider inserts a gloved finger into the rectum to feel the prostate gland. A blood test called a prostate-specific antigen (PSA) test. A procedure in which a sample of tissue is taken from the prostate and checked under a microscope (prostate biopsy). An imaging test called transrectal ultrasonography. Once the condition is diagnosed, tests will be done to determine how far the cancer has spread. This is called staging the cancer. Staging may involve imaging tests, such as: A bone scan. A CT scan. A PET scan. An MRI. The stages of prostate cancer are as follows: Stage I. At this stage, the cancer is found in the prostate only. The cancer is not visible on imaging tests, and it is usually found by accident, such as during prostate surgery. Stage II. At this stage, the cancer is more advanced than it is in stage I, but the cancer has not spread  outside the prostate. Stage III. At this stage, the cancer has spread beyond the outer layer of the prostate to nearby tissues. The cancer may be found in the seminal vesicles, which are near the bladder and the prostate. Stage IV. At this stage, the cancer has spread to other parts of the body, such as the lymph nodes, bones, bladder, rectum, liver, or lungs. How is this treated? Treatment for this condition depends on several factors, including the stage of the cancer, your age, personal preferences, and your overall health. Talk with your health care provider about treatment options that are recommended for you. Common treatments include: Observation for early stage prostate cancer (active surveillance). This involves having exams, blood tests, and in some cases, more biopsies. For some men, this is the only treatment needed. Surgery. Types of surgeries include: Open surgery (prostatectomy). In this surgery, a larger incision is made to remove the prostate. A laparoscopic prostatectomy. This is a surgery to remove the prostate and lymph nodes through several, small incisions. It is often referred to as a minimally invasive surgery. A robotic prostatectomy. This is laparoscopic surgery to remove the prostate and lymph nodes with the help of robotic arms that are controlled by the surgeon. Orchiectomy. This is surgery to remove the testicles. Cryosurgery. This is surgery to freeze and destroy cancer cells. Radiation treatment. Types of radiation treatment include: External beam radiation. This type aims beams of radiation from outside the body at the prostate to destroy cancerous cells. Brachytherapy. This type uses radioactive needles,   seeds, wires, or tubes that are implanted into the prostate gland. Like external beam radiation, brachytherapy destroys cancerous cells. An advantage is that this type of radiation limits the damage to surrounding tissue and has fewer side effects. High-intensity,  focused ultrasonography. This treatment destroys cancer cells by delivering high-energy ultrasound waves to the cancerous cells. Chemotherapy medicines. This treatment kills cancer cells or stops them from multiplying. It kills both cancer cells and normal cells. Targeted therapy. This treatment uses medicines to kill cancer cells without damaging normal cells. Hormone treatment. This treatment involves taking medicines that act on one of the male hormones (testosterone): By stopping your body from producing testosterone. By blocking testosterone from reaching cancer cells. Follow these instructions at home: Take over-the-counter and prescription medicines only as told by your health care provider. Maintain a healthy diet. Get plenty of sleep. Consider joining a support group for men who have prostate cancer. Meeting with a support group may help you learn to manage the stress of having cancer. If you have to go to the hospital, notify your cancer specialist (oncologist). Treatment for prostate cancer may affect sexual function. Continue to have intimate moments with your partner. This may include touching, holding, hugging, and caressing. Keep all follow-up visits as told by your health care provider. This is important. Contact a health care provider if: You have new or increasing trouble urinating. You have new or increasing blood in your urine. You have new or increasing pain in your hips, back, or chest. Get help right away if: You have weakness or numbness in your legs. You cannot control urination or your bowel movements (incontinence). You have chills or a fever. Summary The prostate is a small gland that is involved in the production of semen. It is located below a man's bladder, in front of the rectum. Prostate cancer is the abnormal growth of cells in the prostate gland. Treatment for this condition depends on the stage of the cancer, your age, personal preferences, and your  overall health. Talk with your health care provider about treatment options that are recommended for you. Consider joining a support group for men who have prostate cancer. Meeting with a support group may help you learn to cope with the stress of having cancer. This information is not intended to replace advice given to you by your health care provider. Make sure you discuss any questions you have with your healthcare provider. Document Revised: 07/20/2019 Document Reviewed: 07/20/2019 Elsevier Patient Education  2022 [employer]  

## 2021-03-02 NOTE — Progress Notes (Signed)
03/02/2021 10:18 AM   Caleb Pugh [birth year] [id number]  Referring provider: Monico Blitz, MD [address] [place],  [place] [address]  Followup prostate cancer   HPI:  [name] is a 75yo here for followup for prostate cancer. He met with radiation oncology and has elected to proceed with IMRT. He has mild LUTS. No other complaints today  PMH: Past Medical History:  Diagnosis Date   BPH (benign prostatic hyperplasia)    Cancer ([place])    Diabetes mellitus without complication ([place])    High cholesterol    Hypertension     Surgical History: Past Surgical History:  Procedure Laterality Date   CATARACT EXTRACTION W/PHACO Left 12/11/2020   Procedure: CATARACT EXTRACTION PHACO AND INTRAOCULAR LENS PLACEMENT LEFT EYE;  Surgeon: Baruch Goldmann, MD;  Location: [facility];  Service: Ophthalmology;  Laterality: Left;  left CDE=13.96   TONSILLECTOMY      Home Medications:  Allergies as of 03/02/2021   No Known Allergies      Medication List        Accurate as of March 02, 2021 10:18 AM. If you have any questions, ask your nurse or doctor.          Accu-Chek Guide test strip Generic drug: glucose blood   alfuzosin 10 MG 24 hr tablet Commonly known as: UROXATRAL Take 1 tablet (10 mg total) by mouth at bedtime. What changed: when to take this   aspirin EC 81 MG tablet Take 81 mg by mouth daily. Swallow whole.   doxycycline 100 MG capsule Commonly known as: VIBRAMYCIN Take 100 mg by mouth 2 (two) times daily.   famotidine 20 MG tablet Commonly known as: PEPCID Take 20 mg by mouth at bedtime as needed for heartburn or indigestion.   lisinopril-hydrochlorothiazide 20-12.5 MG tablet Commonly known as: ZESTORETIC Take 2 tablets by mouth daily.   metFORMIN 500 MG tablet Commonly known as: GLUCOPHAGE Take 500 mg by mouth daily.   naproxen sodium 220 MG tablet Commonly known as: ALEVE Take 220 mg by mouth daily as needed (pain).   simvastatin 40 MG  tablet Commonly known as: ZOCOR Take 40 mg by mouth at bedtime.   vismodegib 150 MG capsule Commonly known as: ERIVEDGE Take 1 capsule (150 mg total) by mouth daily.        Allergies: No Known Allergies  Family History: No family history on file.  Social History:  reports that he has been smoking cigarettes. He has a 30.00 pack-year smoking history. He has never used smokeless tobacco. He reports previous alcohol use. He reports that he does not use drugs.  ROS: All other review of systems were reviewed and are negative except what is noted above in HPI  Physical Exam: BP (!) 148/69   Pulse 89   Constitutional:  Alert and oriented, No acute distress. HEENT: [place] AT, moist mucus membranes.  Trachea midline, no masses. Cardiovascular: No clubbing, cyanosis, or edema. Respiratory: Normal respiratory effort, no increased work of breathing. GI: Abdomen is soft, nontender, nondistended, no abdominal masses GU: No CVA tenderness.  Lymph: No cervical or inguinal lymphadenopathy. Skin: No rashes, bruises or suspicious lesions. Neurologic: Grossly intact, no focal deficits, moving all 4 extremities. Psychiatric: Normal mood and affect.  Laboratory Data: Lab Results  Component Value Date   WBC 11.2 (H) 02/07/2021   HGB 14.2 02/07/2021   HCT 41.6 02/07/2021   MCV 88.9 02/07/2021   PLT 203 02/07/2021    Lab Results  Component Value Date  CREATININE 1.12 02/07/2021    No results found for: PSA  No results found for: TESTOSTERONE  No results found for: HGBA1C  Urinalysis    Component Value Date/Time   APPEARANCEUR Clear 09/29/2020 1140   GLUCOSEU Negative 09/29/2020 1140   BILIRUBINUR Negative 09/29/2020 1140   PROTEINUR Negative 09/29/2020 1140   NITRITE Negative 09/29/2020 1140   LEUKOCYTESUR Negative 09/29/2020 1140    Lab Results  Component Value Date   LABMICR Comment 09/29/2020   WBCUA None seen 08/04/2020   LABEPIT None seen 08/04/2020   BACTERIA None  seen 08/04/2020    Pertinent Imaging:  No results found for this or any previous visit.  No results found for this or any previous visit.  No results found for this or any previous visit.  No results found for this or any previous visit.  No results found for this or any previous visit.  No results found for this or any previous visit.  No results found for this or any previous visit.  No results found for this or any previous visit.   Assessment & Plan:    1. Prostate cancer [facility]) I discussed the natural history of high risk prostate cancer with the patient and the various treatment options including active surveillance, RALP, IMRT, brachytherapy, cryotherapy, HIFU and ADT. After discussing the options the patient elects for IMRT. We will start LT-ADT today and we will scheduled for fiducial markers and SpaceOAR in 6-7 weeks.  - Urinalysis, Routine w reflex microscopic   No follow-ups on file.  Nicolette Bang, MD  [facility] [place]

## 2021-03-06 DIAGNOSIS — Z299 Encounter for prophylactic measures, unspecified: Secondary | ICD-10-CM | POA: Diagnosis not present

## 2021-03-06 DIAGNOSIS — E1142 Type 2 diabetes mellitus with diabetic polyneuropathy: Secondary | ICD-10-CM | POA: Diagnosis not present

## 2021-03-06 DIAGNOSIS — R030 Elevated blood-pressure reading, without diagnosis of hypertension: Secondary | ICD-10-CM | POA: Diagnosis not present

## 2021-03-06 DIAGNOSIS — Z7189 Other specified counseling: Secondary | ICD-10-CM | POA: Diagnosis not present

## 2021-03-06 DIAGNOSIS — Z79899 Other long term (current) drug therapy: Secondary | ICD-10-CM | POA: Diagnosis not present

## 2021-03-06 DIAGNOSIS — R5383 Other fatigue: Secondary | ICD-10-CM | POA: Diagnosis not present

## 2021-03-06 DIAGNOSIS — E7800 Pure hypercholesterolemia, unspecified: Secondary | ICD-10-CM | POA: Diagnosis not present

## 2021-03-06 DIAGNOSIS — Z0000 Encounter for general adult medical examination without abnormal findings: Secondary | ICD-10-CM | POA: Diagnosis not present

## 2021-03-07 DIAGNOSIS — S01111S Laceration without foreign body of right eyelid and periocular area, sequela: Secondary | ICD-10-CM | POA: Diagnosis not present

## 2021-03-07 DIAGNOSIS — S0181XS Laceration without foreign body of other part of head, sequela: Secondary | ICD-10-CM | POA: Diagnosis not present

## 2021-03-07 DIAGNOSIS — C441122 Basal cell carcinoma of skin of right lower eyelid, including canthus: Secondary | ICD-10-CM | POA: Diagnosis not present

## 2021-03-07 DIAGNOSIS — S01112S Laceration without foreign body of left eyelid and periocular area, sequela: Secondary | ICD-10-CM | POA: Diagnosis not present

## 2021-03-07 DIAGNOSIS — C441121 Basal cell carcinoma of skin of right upper eyelid, including canthus: Secondary | ICD-10-CM | POA: Diagnosis not present

## 2021-03-07 DIAGNOSIS — H2513 Age-related nuclear cataract, bilateral: Secondary | ICD-10-CM | POA: Diagnosis not present

## 2021-03-09 DIAGNOSIS — I10 Essential (primary) hypertension: Secondary | ICD-10-CM | POA: Diagnosis not present

## 2021-03-09 DIAGNOSIS — C44111 Basal cell carcinoma of skin of unspecified eyelid, including canthus: Secondary | ICD-10-CM | POA: Diagnosis not present

## 2021-03-09 DIAGNOSIS — Z79899 Other long term (current) drug therapy: Secondary | ICD-10-CM | POA: Diagnosis not present

## 2021-03-09 DIAGNOSIS — F17210 Nicotine dependence, cigarettes, uncomplicated: Secondary | ICD-10-CM | POA: Diagnosis not present

## 2021-03-09 DIAGNOSIS — E119 Type 2 diabetes mellitus without complications: Secondary | ICD-10-CM | POA: Diagnosis not present

## 2021-03-09 DIAGNOSIS — C44101 Unspecified malignant neoplasm of skin of unspecified eyelid, including canthus: Secondary | ICD-10-CM | POA: Diagnosis not present

## 2021-03-09 DIAGNOSIS — E7800 Pure hypercholesterolemia, unspecified: Secondary | ICD-10-CM | POA: Diagnosis not present

## 2021-03-09 DIAGNOSIS — Z7984 Long term (current) use of oral hypoglycemic drugs: Secondary | ICD-10-CM | POA: Diagnosis not present

## 2021-03-15 ENCOUNTER — Encounter: Payer: Self-pay | Admitting: Plastic Surgery

## 2021-03-15 ENCOUNTER — Ambulatory Visit: Payer: Medicare Other | Admitting: Plastic Surgery

## 2021-03-15 ENCOUNTER — Other Ambulatory Visit: Payer: Self-pay

## 2021-03-15 VITALS — BP 129/75 | HR 98 | Ht 72.0 in | Wt 173.2 lb

## 2021-03-15 DIAGNOSIS — C44111 Basal cell carcinoma of skin of unspecified eyelid, including canthus: Secondary | ICD-10-CM | POA: Diagnosis not present

## 2021-03-15 NOTE — Progress Notes (Signed)
Referring Provider Monico Blitz, [place],  [place] [id number]   CC:  Chief Complaint  Patient presents with   Advice Only      Caleb Pugh is an 75 y.o. male.  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  He reports having a lesion in this area for at least 11 years.  It was ultimately biopsied yielding the basal cell carcinoma diagnosis.  He was sent by Dr. Leonard Schwartz for my help with reconstructing the defect.  He is also planning to see Dr. Winifred Olive for Mohs excision in this area.  Patient also has been diagnosed with prostate cancer and is planning to get radiation for that.  No Known Allergies  Outpatient Encounter Medications as of 03/15/2021  Medication Sig Note   ACCU-CHEK GUIDE test strip     alfuzosin (UROXATRAL) 10 MG 24 hr tablet Take 1 tablet (10 mg total) by mouth at bedtime. (Patient taking differently: Take 10 mg by mouth daily with supper.)    aspirin EC 81 MG tablet Take 81 mg by mouth daily. Swallow whole. 12/01/2020: On hold pre procedure   famotidine (PEPCID) 20 MG tablet Take 20 mg by mouth at bedtime as needed for heartburn or indigestion.    lisinopril-hydrochlorothiazide (ZESTORETIC) 20-12.5 MG tablet Take 2 tablets by mouth daily.    metFORMIN (GLUCOPHAGE) 500 MG tablet Take 500 mg by mouth daily.    naproxen sodium (ALEVE) 220 MG tablet Take 220 mg by mouth daily as needed (pain). 03/15/2021: Pt reports PRN   simvastatin (ZOCOR) 40 MG tablet Take 40 mg by mouth at bedtime.    vismodegib (ERIVEDGE) 150 MG capsule Take 1 capsule (150 mg total) by mouth daily.    [DISCONTINUED] doxycycline (VIBRAMYCIN) 100 MG capsule Take 100 mg by mouth 2 (two) times daily. (Patient not taking: Reported on 03/15/2021) 12/01/2020: Started 11/24/20 for 10 days   No facility-administered encounter medications on file as of 03/15/2021.     Past Medical History:  Diagnosis Date   BPH (benign prostatic hyperplasia)     Cancer ([place])    Diabetes mellitus without complication ([place])    High cholesterol    Hypertension     Past Surgical History:  Procedure Laterality Date   CATARACT EXTRACTION W/PHACO Left 12/11/2020   Procedure: CATARACT EXTRACTION PHACO AND INTRAOCULAR LENS PLACEMENT LEFT EYE;  Surgeon: Baruch Goldmann, MD;  Location: [facility];  Service: Ophthalmology;  Laterality: Left;  left CDE=13.96   TONSILLECTOMY      No family history on file.  Social History   Social History Narrative   Not on file  Reports regular tobacco use.  Review of Systems General: Denies fevers, chills, weight loss CV: Denies chest pain, shortness of breath, palpitations  Physical Exam Vitals with BMI 03/15/2021 03/02/2021 02/27/2021  Height '6\' 0"'$  - '6\' 0"'$   Weight 173 lbs 3 oz - 173 lbs 4 oz  BMI A999333 - A999333  Systolic Q000111Q [id number] [id number]  Diastolic 75 69 77  Pulse 98 89 79    General:  No acute distress,  Alert and oriented, Non-Toxic, Normal speech and affect On examination patient has a 2 cm diameter lesion in the area of the medial canthus.  Does not feel fixed to bone.  No scars on the forehead that are obvious to me.  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  I explained the complexity of reconstruction  and the likelihood that his excision would involve the eyelid and the canalicular system.  We will likely do a joint reconstruction with oculoplastic surgeon Dr. Leonard Schwartz.  I explained my portion of the reconstruction would ultimately depend on the size of the defect but might involve a forehead flap to help get enough soft tissue coverage for that area.  I did mention that the forehead flap would be a staged procedure with a second stage being 3 weeks later.  Patient is understandably nervous about how this process will interfere with his radiation for his prostate cancer and after he is seen his Mohs surgeon we will plan to get together to discuss the  timing of all that.  We went over some of the risks of the reconstruction including bleeding, infection, damage to surrounding structures and need for additional procedures.  We discussed potential complications with the eye that could be encountered as well but Dr. Leonard Schwartz could shed light on that better than I.  We will plan to continue the process moving forward and I will be available to help with his reconstruction.  Cindra Presume 03/15/2021, 4:47 PM

## 2021-03-21 DIAGNOSIS — C441121 Basal cell carcinoma of skin of right upper eyelid, including canthus: Secondary | ICD-10-CM | POA: Diagnosis not present

## 2021-03-29 ENCOUNTER — Other Ambulatory Visit: Payer: Medicare Other

## 2021-03-30 ENCOUNTER — Ambulatory Visit (INDEPENDENT_AMBULATORY_CARE_PROVIDER_SITE_OTHER): Payer: Medicare Other

## 2021-03-30 ENCOUNTER — Other Ambulatory Visit: Payer: Self-pay

## 2021-03-30 DIAGNOSIS — C61 Malignant neoplasm of prostate: Secondary | ICD-10-CM | POA: Diagnosis not present

## 2021-03-30 MED ORDER — LEUPROLIDE ACETATE (6 MONTH) 45 MG ~~LOC~~ KIT: 45.0000 mg | PACK | Freq: Once | SUBCUTANEOUS | Status: DC

## 2021-03-30 NOTE — Progress Notes (Signed)
Eligard SubQ Injection   Due to Prostate Cancer patient is present today for a Eligard Injection.  Medication: Eligard 6 month Dose: 45 mg  Location: right    Patient tolerated well, no complications were noted  Performed by: Estill Bamberg RN

## 2021-04-03 DIAGNOSIS — L570 Actinic keratosis: Secondary | ICD-10-CM | POA: Diagnosis not present

## 2021-04-06 ENCOUNTER — Ambulatory Visit: Payer: Medicare Other | Admitting: Urology

## 2021-04-10 DIAGNOSIS — F17210 Nicotine dependence, cigarettes, uncomplicated: Secondary | ICD-10-CM | POA: Diagnosis not present

## 2021-04-10 DIAGNOSIS — E1165 Type 2 diabetes mellitus with hyperglycemia: Secondary | ICD-10-CM | POA: Diagnosis not present

## 2021-04-10 DIAGNOSIS — I10 Essential (primary) hypertension: Secondary | ICD-10-CM | POA: Diagnosis not present

## 2021-04-10 DIAGNOSIS — Z299 Encounter for prophylactic measures, unspecified: Secondary | ICD-10-CM | POA: Diagnosis not present

## 2021-04-11 ENCOUNTER — Other Ambulatory Visit: Payer: Medicare Other

## 2021-04-11 DIAGNOSIS — C441121 Basal cell carcinoma of skin of right upper eyelid, including canthus: Secondary | ICD-10-CM | POA: Diagnosis not present

## 2021-04-11 DIAGNOSIS — H04551 Acquired stenosis of right nasolacrimal duct: Secondary | ICD-10-CM | POA: Diagnosis not present

## 2021-04-11 DIAGNOSIS — C441122 Basal cell carcinoma of skin of right lower eyelid, including canthus: Secondary | ICD-10-CM | POA: Diagnosis not present

## 2021-04-11 DIAGNOSIS — C441191 Basal cell carcinoma of skin of left upper eyelid, including canthus: Secondary | ICD-10-CM | POA: Diagnosis not present

## 2021-04-11 DIAGNOSIS — H04541 Stenosis of right lacrimal canaliculi: Secondary | ICD-10-CM | POA: Diagnosis not present

## 2021-04-11 DIAGNOSIS — J342 Deviated nasal septum: Secondary | ICD-10-CM | POA: Diagnosis not present

## 2021-04-18 ENCOUNTER — Ambulatory Visit: Payer: Medicare Other | Admitting: Urology

## 2021-04-18 DIAGNOSIS — K219 Gastro-esophageal reflux disease without esophagitis: Secondary | ICD-10-CM | POA: Diagnosis not present

## 2021-04-18 DIAGNOSIS — I10 Essential (primary) hypertension: Secondary | ICD-10-CM | POA: Diagnosis not present

## 2021-04-18 NOTE — Patient Instructions (Addendum)
Caleb Pugh  04/18/2021     '@PREFPERIOPPHARMACY'$ @   Your procedure is scheduled on  04/26/2021.   Report to [name] at  [phone]  A.M.   Call this number if you have problems the morning of surgery:  [phone]   Remember:  Do not eat or drink after midnight.   DO NOT take any medications for diabetes the morning of your procedure.    Take these medicines the morning of surgery with A SIP OF WATER  None    Do not wear jewelry, make-up or nail polish.  Do not wear lotions, powders, or perfumes, or deodorant.  Do not shave 48 hours prior to surgery.  Men may shave face and neck.  Do not bring valuables to the hospital.  [facility] is not responsible for any belongings or valuables.  Contacts, dentures or bridgework may not be worn into surgery.  Leave your suitcase in the car.  After surgery it may be brought to your room.  For patients admitted to the hospital, discharge time will be determined by your treatment team.  Patients discharged the day of surgery will not be allowed to drive home and must have someone with them for 24 hours.    Special instructions:   DO NOT smoke tobacco or vape for 24 hours before your procedure.  Please read over the following fact sheets that you were given. Anesthesia Post-op Instructions and Care and Recovery After Surgery      Transrectal Ultrasound-Guided Prostate Gold Seed Placement, Care After The following information offers guidance on how to care for yourself after your procedure. Your health care provider may also give you more specific instructions. If you have problems or questions, contact your health care provider. What can I expect after the procedure? After the procedure, it is common to have: Light bleeding from the rectum. Bruising or tenderness in the area behind the scrotum (perineum), if the needle was put into your prostate through this area. Small amounts of blood in your urine. This should only  last for a few days. Light brown or red semen. This may last for a couple of weeks. Follow these instructions at home: Medicines  Take over-the-counter and prescription medicines only as told by your health care provider. If you were prescribed an antibiotic, take it as told by your health care provider. Do not stop taking the antibiotic early, even if you start to feel better. Ask your health care provider if the medicine prescribed to you requires you to avoid driving or using machinery. Eating and drinking Follow instructions from your health care provider about eating or drinking restrictions. Drink enough fluid to keep your urine pale yellow. Managing pain and swelling If directed, put ice on the affected area. To do this: Put ice in a plastic bag. Place a towel between your skin and the bag. Leave the ice on for 20 minutes, 2-3 times a day. Be sure to remove the ice if your skin turns bright red. If you cannot feel pain, heat, or cold, you have a greater risk of damage to the area. Try not to sit directly on the area behind the scrotum. A soft cushion can help with discomfort. Activity If you were given a sedative during the procedure, it can affect you for several hours. Do not drive or operate machinery until your health care provider says that it is safe. Return to your normal activities as told by your  health care provider. Ask your health care provider what activities are safe for you. Follow instructions from your health care provider about when it is safe for you to engage in sexual activity. General instructions Plan to have a responsible adult care for you for the time you are told after you leave the hospital or clinic. Do not take baths, swim, or use a hot tub until your health care provider approves. Ask your health care provider if you may take showers. You may only be allowed to take sponge baths. Keep all follow-up visits. Contact a health care provider if: You have a  fever or chills. You have more blood in your urine. You have blood in your urine for more than 2-3 days after the procedure. You have trouble passing urine or having a bowel movement. You have pain or burning when urinating. You have nausea or you vomit. Get help right away if: You have severe pain that does not get better with medicine. Your urine is bright red. You cannot urinate. You have rectal bleeding that gets worse. You have shortness of breath. Summary After the procedure, you may have blood in your urine and light bleeding from the rectum. Return to your normal activities as told by your health care provider. Ask your health care provider what activities are safe for you. Take over-the-counter and prescription medicines only as told by your health care provider. Contact your health care provider right away if your urine is bright red or you cannot pass urine. This information is not intended to replace advice given to you by your health care provider. Make sure you discuss any questions you have with your health care provider. Document Revised: 11/01/2020 Document Reviewed: 11/01/2020 Elsevier Patient Education  [place] Anesthesia, Adult, Care After This sheet gives you information about how to care for yourself after your procedure. Your health care provider may also give you more specific instructions. If you have problems or questions, contact your health care provider. What can I expect after the procedure? After the procedure, the following side effects are common: Pain or discomfort at the IV site. Nausea. Vomiting. Sore throat. Trouble concentrating. Feeling cold or chills. Feeling weak or tired. Sleepiness and fatigue. Soreness and body aches. These side effects can affect parts of the body that were not involved in surgery. Follow these instructions at home: For the time period you were told by your health care provider:  Rest. Do not  participate in activities where you could fall or become injured. Do not drive or use machinery. Do not drink alcohol. Do not take sleeping pills or medicines that cause drowsiness. Do not make important decisions or sign legal documents. Do not take care of children on your own. Eating and drinking Follow any instructions from your health care provider about eating or drinking restrictions. When you feel hungry, start by eating small amounts of foods that are soft and easy to digest (bland), such as toast. Gradually return to your regular diet. Drink enough fluid to keep your urine pale yellow. If you vomit, rehydrate by drinking water, juice, or clear broth. General instructions If you have sleep apnea, surgery and certain medicines can increase your risk for breathing problems. Follow instructions from your health care provider about wearing your sleep device: Anytime you are sleeping, including during daytime naps. While taking prescription pain medicines, sleeping medicines, or medicines that make you drowsy. Have a responsible adult stay with you for the time you are told.  It is important to have someone help care for you until you are awake and alert. Return to your normal activities as told by your health care provider. Ask your health care provider what activities are safe for you. Take over-the-counter and prescription medicines only as told by your health care provider. If you smoke, do not smoke without supervision. Keep all follow-up visits as told by your health care provider. This is important. Contact a health care provider if: You have nausea or vomiting that does not get better with medicine. You cannot eat or drink without vomiting. You have pain that does not get better with medicine. You are unable to pass urine. You develop a skin rash. You have a fever. You have redness around your IV site that gets worse. Get help right away if: You have difficulty breathing. You  have chest pain. You have blood in your urine or stool, or you vomit blood. Summary After the procedure, it is common to have a sore throat or nausea. It is also common to feel tired. Have a responsible adult stay with you for the time you are told. It is important to have someone help care for you until you are awake and alert. When you feel hungry, start by eating small amounts of foods that are soft and easy to digest (bland), such as toast. Gradually return to your regular diet. Drink enough fluid to keep your urine pale yellow. Return to your normal activities as told by your health care provider. Ask your health care provider what activities are safe for you. This information is not intended to replace advice given to you by your health care provider. Make sure you discuss any questions you have with your health care provider. Document Revised: 04/20/2020 Document Reviewed: 11/18/2019 Elsevier Patient Education  2022 [place]. How to Use Chlorhexidine for Bathing Chlorhexidine gluconate (CHG) is a germ-killing (antiseptic) solution that is used to clean the skin. It can get rid of the bacteria that normally live on the skin and can keep them away for about 24 hours. To clean your skin with CHG, you may be given: A CHG solution to use in the shower or as part of a sponge bath. A prepackaged cloth that contains CHG. Cleaning your skin with CHG may help lower the risk for infection: While you are staying in the intensive care unit of the hospital. If you have a vascular access, such as a central line, to provide short-term or long-term access to your veins. If you have a catheter to drain urine from your bladder. If you are on a ventilator. A ventilator is a machine that helps you breathe by moving air in and out of your lungs. After surgery. What are the risks? Risks of using CHG include: A skin reaction. Hearing loss, if CHG gets in your ears and you have a perforated eardrum. Eye  injury, if CHG gets in your eyes and is not rinsed out. The CHG product catching fire. Make sure that you avoid smoking and flames after applying CHG to your skin. Do not use CHG: If you have a chlorhexidine allergy or have previously reacted to chlorhexidine. On babies younger than 94 months of age. How to use CHG solution Use CHG only as told by your health care provider, and follow the instructions on the label. Use the full amount of CHG as directed. Usually, this is one bottle. During a shower Follow these steps when using CHG solution during a shower (unless your  health care provider gives you different instructions): Start the shower. Use your normal soap and shampoo to wash your face and hair. Turn off the shower or move out of the shower stream. Pour the CHG onto a clean washcloth. Do not use any type of brush or rough-edged sponge. Starting at your neck, lather your body down to your toes. Make sure you follow these instructions: If you will be having surgery, pay special attention to the part of your body where you will be having surgery. Scrub this area for at least 1 minute. Do not use CHG on your head or face. If the solution gets into your ears or eyes, rinse them well with water. Avoid your genital area. Avoid any areas of skin that have broken skin, cuts, or scrapes. Scrub your back and under your arms. Make sure to wash skin folds. Let the lather sit on your skin for 1-2 minutes or as long as told by your health care provider. Thoroughly rinse your entire body in the shower. Make sure that all body creases and crevices are rinsed well. Dry off with a clean towel. Do not put any substances on your body afterward--such as powder, lotion, or perfume--unless you are told to do so by your health care provider. Only use lotions that are recommended by the manufacturer. Put on clean clothes or pajamas. If it is the night before your surgery, sleep in clean sheets.  During a  sponge bath Follow these steps when using CHG solution during a sponge bath (unless your health care provider gives you different instructions): Use your normal soap and shampoo to wash your face and hair. Pour the CHG onto a clean washcloth. Starting at your neck, lather your body down to your toes. Make sure you follow these instructions: If you will be having surgery, pay special attention to the part of your body where you will be having surgery. Scrub this area for at least 1 minute. Do not use CHG on your head or face. If the solution gets into your ears or eyes, rinse them well with water. Avoid your genital area. Avoid any areas of skin that have broken skin, cuts, or scrapes. Scrub your back and under your arms. Make sure to wash skin folds. Let the lather sit on your skin for 1-2 minutes or as long as told by your health care provider. Using a different clean, wet washcloth, thoroughly rinse your entire body. Make sure that all body creases and crevices are rinsed well. Dry off with a clean towel. Do not put any substances on your body afterward--such as powder, lotion, or perfume--unless you are told to do so by your health care provider. Only use lotions that are recommended by the manufacturer. Put on clean clothes or pajamas. If it is the night before your surgery, sleep in clean sheets. How to use CHG prepackaged cloths Only use CHG cloths as told by your health care provider, and follow the instructions on the label. Use the CHG cloth on clean, dry skin. Do not use the CHG cloth on your head or face unless your health care provider tells you to. When washing with the CHG cloth: Avoid your genital area. Avoid any areas of skin that have broken skin, cuts, or scrapes. Before surgery Follow these steps when using a CHG cloth to clean before surgery (unless your health care provider gives you different instructions): Using the CHG cloth, vigorously scrub the part of your body  where you will be  having surgery. Scrub using a back-and-forth motion for 3 minutes. The area on your body should be completely wet with CHG when you are done scrubbing. Do not rinse. Discard the cloth and let the area air-dry. Do not put any substances on the area afterward, such as powder, lotion, or perfume. Put on clean clothes or pajamas. If it is the night before your surgery, sleep in clean sheets.  For general bathing Follow these steps when using CHG cloths for general bathing (unless your health care provider gives you different instructions). Use a separate CHG cloth for each area of your body. Make sure you wash between any folds of skin and between your fingers and toes. Wash your body in the following order, switching to a new cloth after each step: The front of your neck, shoulders, and chest. Both of your arms, under your arms, and your hands. Your stomach and groin area, avoiding the genitals. Your right leg and foot. Your left leg and foot. The back of your neck, your back, and your buttocks. Do not rinse. Discard the cloth and let the area air-dry. Do not put any substances on your body afterward--such as powder, lotion, or perfume--unless you are told to do so by your health care provider. Only use lotions that are recommended by the manufacturer. Put on clean clothes or pajamas. Contact a health care provider if: Your skin gets irritated after scrubbing. You have questions about using your solution or cloth. You swallow any chlorhexidine. Call your local poison control center (1-272-677-2928 in the [place] Get help right away if: Your eyes itch badly, or they become very red or swollen. Your skin itches badly and is red or swollen. Your hearing changes. You have trouble seeing. You have swelling or tingling in your mouth or throat. You have trouble breathing. These symptoms may represent a serious problem that is an emergency. Do not wait to see if the symptoms will go  away. Get medical help right away. Call your local emergency services (911 in the [place] Do not drive yourself to the hospital. Summary Chlorhexidine gluconate (CHG) is a germ-killing (antiseptic) solution that is used to clean the skin. Cleaning your skin with CHG may help to lower your risk for infection. You may be given CHG to use for bathing. It may be in a bottle or in a prepackaged cloth to use on your skin. Carefully follow your health care provider's instructions and the instructions on the product label. Do not use CHG if you have a chlorhexidine allergy. Contact your health care provider if your skin gets irritated after scrubbing. This information is not intended to replace advice given to you by your health care provider. Make sure you discuss any questions you have with your health care provider. Document Revised: 10/16/2020 Document Reviewed: 10/16/2020 Elsevier Patient Education  2022 [employer].

## 2021-04-19 ENCOUNTER — Telehealth: Payer: Self-pay | Admitting: Oncology

## 2021-04-19 NOTE — Telephone Encounter (Signed)
Called patient regarding upcoming September appointments, patient does not have voicemail set up. Calender will be mailed and patient is on my chart.

## 2021-04-24 ENCOUNTER — Other Ambulatory Visit: Payer: Self-pay

## 2021-04-24 ENCOUNTER — Encounter (HOSPITAL_COMMUNITY)
Admission: RE | Admit: 2021-04-24 | Discharge: 2021-04-24 | Disposition: A | Discharge status: Home / Self Care | Payer: Medicare Other | Source: Ambulatory Visit | Attending: Urology | Admitting: Urology

## 2021-04-24 ENCOUNTER — Encounter (HOSPITAL_COMMUNITY): Payer: Self-pay

## 2021-04-24 VITALS — BP 159/78 | HR 78 | Temp 98.4°F | Resp 18 | Ht 72.0 in | Wt 175.0 lb

## 2021-04-24 DIAGNOSIS — E119 Type 2 diabetes mellitus without complications: Secondary | ICD-10-CM | POA: Diagnosis not present

## 2021-04-24 DIAGNOSIS — Z01818 Encounter for other preprocedural examination: Secondary | ICD-10-CM | POA: Diagnosis not present

## 2021-04-24 LAB — BASIC METABOLIC PANEL
Anion gap: 10 (ref 5–15)
BUN: 19 mg/dL (ref 8–23)
CO2: 26 mmol/L (ref 22–32)
Calcium: 9.1 mg/dL (ref 8.9–10.3)
Chloride: 100 mmol/L (ref 98–111)
Creatinine, Ser: 0.76 mg/dL (ref 0.61–1.24)
GFR, Estimated: >60 mL/min (ref >60)
Glucose, Bld: 132 mg/dL — ABNORMAL HIGH (ref 70–99)
Potassium: 3.8 mmol/L (ref 3.5–5.1)
Sodium: 136 mmol/L (ref 135–145)

## 2021-04-24 LAB — HEMOGLOBIN A1C
Hgb A1c MFr Bld: 7.2 % — ABNORMAL HIGH (ref 4.8–5.6)
Mean Plasma Glucose: 159.94 mg/dL

## 2021-04-26 ENCOUNTER — Encounter (HOSPITAL_COMMUNITY)
Admission: RE | Disposition: A | Discharge status: Home / Self Care | Payer: Self-pay | Source: Ambulatory Visit | Attending: Urology

## 2021-04-26 ENCOUNTER — Ambulatory Visit (HOSPITAL_COMMUNITY): Payer: Medicare Other | Admitting: Anesthesiology

## 2021-04-26 ENCOUNTER — Ambulatory Visit (HOSPITAL_COMMUNITY): Payer: Medicare Other

## 2021-04-26 ENCOUNTER — Ambulatory Visit (HOSPITAL_COMMUNITY)
Admission: RE | Admit: 2021-04-26 | Discharge: 2021-04-26 | Disposition: A | Discharge status: Home / Self Care | Payer: Medicare Other | Source: Ambulatory Visit | Attending: Urology | Admitting: Urology

## 2021-04-26 ENCOUNTER — Encounter (HOSPITAL_COMMUNITY): Payer: Self-pay | Admitting: Urology

## 2021-04-26 ENCOUNTER — Other Ambulatory Visit (HOSPITAL_COMMUNITY): Payer: Self-pay | Admitting: Radiology

## 2021-04-26 DIAGNOSIS — F17210 Nicotine dependence, cigarettes, uncomplicated: Secondary | ICD-10-CM | POA: Insufficient documentation

## 2021-04-26 DIAGNOSIS — Z79899 Other long term (current) drug therapy: Secondary | ICD-10-CM | POA: Insufficient documentation

## 2021-04-26 DIAGNOSIS — E119 Type 2 diabetes mellitus without complications: Secondary | ICD-10-CM | POA: Insufficient documentation

## 2021-04-26 DIAGNOSIS — C61 Malignant neoplasm of prostate: Secondary | ICD-10-CM | POA: Diagnosis present

## 2021-04-26 DIAGNOSIS — N400 Enlarged prostate without lower urinary tract symptoms: Secondary | ICD-10-CM | POA: Diagnosis not present

## 2021-04-26 DIAGNOSIS — L4050 Arthropathic psoriasis, unspecified: Secondary | ICD-10-CM

## 2021-04-26 HISTORY — PX: GOLD SEED IMPLANT: SHX6343

## 2021-04-26 HISTORY — PX: SPACE OAR INSTILLATION: SHX6769

## 2021-04-26 LAB — GLUCOSE, CAPILLARY
Glucose-Capillary: 173 mg/dL — ABNORMAL HIGH (ref 70–99)
Glucose-Capillary: 176 mg/dL — ABNORMAL HIGH (ref 70–99)

## 2021-04-26 SURGERY — INJECTION, HYDROGEL SPACER
Anesthesia: General | Site: Prostate

## 2021-04-26 MED ORDER — PROPOFOL 10 MG/ML IV BOLUS
INTRAVENOUS | Status: DC | PRN
  Administered 2021-04-26: 175 mg via INTRAVENOUS

## 2021-04-26 MED ORDER — CEFAZOLIN SODIUM-DEXTROSE 2-4 GM/100ML-% IV SOLN
2.0000 g | INTRAVENOUS | Status: AC
  Administered 2021-04-26: 2 g via INTRAVENOUS

## 2021-04-26 MED ORDER — CHLORHEXIDINE GLUCONATE 0.12 % MT SOLN
15.0000 mL | Freq: Once | OROMUCOSAL | Status: AC
  Administered 2021-04-26: 15 mL via OROMUCOSAL

## 2021-04-26 MED ORDER — ONDANSETRON HCL 4 MG/2ML IJ SOLN
INTRAMUSCULAR | Status: DC | PRN
  Administered 2021-04-26: 4 mg via INTRAVENOUS

## 2021-04-26 MED ORDER — ORAL CARE MOUTH RINSE: 15.0000 mL | Freq: Once | OROMUCOSAL | Status: AC

## 2021-04-26 MED ORDER — LIDOCAINE HCL (CARDIAC) PF 50 MG/5ML IV SOSY
PREFILLED_SYRINGE | INTRAVENOUS | Status: DC | PRN
  Administered 2021-04-26: 100 mg via INTRAVENOUS

## 2021-04-26 MED ORDER — LACTATED RINGERS IV SOLN
INTRAVENOUS | Status: DC
  Administered 2021-04-26: 1000 mL via INTRAVENOUS

## 2021-04-26 MED ORDER — ONDANSETRON HCL 4 MG/2ML IJ SOLN: 4.0000 mg | Freq: Once | INTRAMUSCULAR | Status: DC | PRN

## 2021-04-26 MED ORDER — EPHEDRINE SULFATE 50 MG/ML IJ SOLN
INTRAMUSCULAR | Status: DC | PRN
  Administered 2021-04-26: 10 mg via INTRAVENOUS

## 2021-04-26 MED ORDER — PROPOFOL 10 MG/ML IV BOLUS
INTRAVENOUS | Status: AC
  Filled 2021-04-26: qty 40

## 2021-04-26 MED ORDER — 0.9 % SODIUM CHLORIDE (POUR BTL) OPTIME
TOPICAL | Status: DC | PRN
  Administered 2021-04-26: 1000 mL

## 2021-04-26 MED ORDER — LIDOCAINE HCL (PF) 2 % IJ SOLN
INTRAMUSCULAR | Status: AC
  Filled 2021-04-26: qty 5

## 2021-04-26 MED ORDER — CEFAZOLIN SODIUM-DEXTROSE 2-4 GM/100ML-% IV SOLN
INTRAVENOUS | Status: AC
  Filled 2021-04-26: qty 100

## 2021-04-26 MED ORDER — FENTANYL CITRATE (PF) 100 MCG/2ML IJ SOLN
INTRAMUSCULAR | Status: AC
  Filled 2021-04-26: qty 2

## 2021-04-26 MED ORDER — FENTANYL CITRATE PF 50 MCG/ML IJ SOSY: 25.0000 ug | PREFILLED_SYRINGE | INTRAMUSCULAR | Status: DC | PRN

## 2021-04-26 MED ORDER — FENTANYL CITRATE (PF) 100 MCG/2ML IJ SOLN
INTRAMUSCULAR | Status: DC | PRN
  Administered 2021-04-26: 50 ug via INTRAVENOUS

## 2021-04-26 SURGICAL SUPPLY — 21 items
COVER BACK TABLE 60X90IN (DRAPES) ×2 IMPLANT
DRAPE LEGGINS SURG 28X43 STRL (DRAPES) ×2 IMPLANT
DRAPE SURG 17X23 STRL (DRAPES) ×2 IMPLANT
DRSG TEGADERM 8X12 (GAUZE/BANDAGES/DRESSINGS) ×2 IMPLANT
GAUZE SPONGE 4X4 12PLY STRL (GAUZE/BANDAGES/DRESSINGS) ×2 IMPLANT
GLOVE SRG 8 PF TXTR STRL LF DI (GLOVE) ×1 IMPLANT
GLOVE SURG POLYISO LF SZ8 (GLOVE) ×2 IMPLANT
GLOVE SURG UNDER POLY LF SZ7 (GLOVE) ×2 IMPLANT
GLOVE SURG UNDER POLY LF SZ8 (GLOVE) ×1
GOWN STRL REUS W/TWL LRG LVL3 (GOWN DISPOSABLE) ×2 IMPLANT
GOWN STRL REUS W/TWL XL LVL3 (GOWN DISPOSABLE) ×2 IMPLANT
IMPL SPACEOAR SYSTEM 10ML (Spacer) ×1 IMPLANT
IMPLANT SPACEOAR SYSTEM 10ML (Spacer) ×2 IMPLANT
KIT TURNOVER CYSTO (KITS) ×2 IMPLANT
MARKER GOLD PRELOAD 1.2X3 (Urological Implant) ×3 IMPLANT
NS IRRIG 1000ML POUR BTL (IV SOLUTION) ×2 IMPLANT
SEED GOLD PRELOAD 1.2X3 (Urological Implant) ×6 IMPLANT
SURGILUBE 2OZ TUBE FLIPTOP (MISCELLANEOUS) ×2 IMPLANT
TOWEL NATURAL 4PK STERILE (DISPOSABLE) ×2 IMPLANT
UNDERPAD 30X36 HEAVY ABSORB (UNDERPADS AND DIAPERS) ×4 IMPLANT
WATER STERILE IRR 500ML POUR (IV SOLUTION) ×2 IMPLANT

## 2021-04-26 NOTE — Anesthesia Procedure Notes (Addendum)
Procedure Name: LMA Insertion Date/Time: 04/26/2021 8:10 AM Performed by: Ollen Bowl, CRNA Pre-anesthesia Checklist: Patient identified, Patient being monitored, Emergency Drugs available, Timeout performed and Suction available Patient Re-evaluated:Patient Re-evaluated prior to induction Oxygen Delivery Method: Circle System Utilized Preoxygenation: Pre-oxygenation with 100% oxygen Induction Type: IV induction Ventilation: Mask ventilation without difficulty LMA: LMA inserted LMA Size: 5.0 Number of attempts: 1 Placement Confirmation: positive ETCO2 and breath sounds checked- equal and bilateral

## 2021-04-26 NOTE — Anesthesia Preprocedure Evaluation (Signed)
Anesthesia Evaluation  Patient identified by MRN, date of birth, ID band Patient awake    Reviewed: Allergy & Precautions, H&P , NPO status , Patient's Chart, lab work & pertinent test results, reviewed documented beta blocker date and time   Airway Mallampati: II  TM Distance: >3 FB Neck ROM: full    Dental no notable dental hx.    Pulmonary neg pulmonary ROS, Current Smoker and Patient abstained from smoking.,    Pulmonary exam normal breath sounds clear to auscultation       Cardiovascular Exercise Tolerance: Good hypertension, negative cardio ROS   Rhythm:regular Rate:Normal     Neuro/Psych negative neurological ROS  negative psych ROS   GI/Hepatic negative GI ROS, Neg liver ROS,   Endo/Other  negative endocrine ROSdiabetes, Type 2  Renal/GU negative Renal ROS  negative genitourinary   Musculoskeletal   Abdominal   Peds  Hematology negative hematology ROS (+)   Anesthesia Other Findings   Reproductive/Obstetrics negative OB ROS                             Anesthesia Physical Anesthesia Plan  ASA: 2  Anesthesia Plan: General and General LMA   Post-op Pain Management:    Induction:   PONV Risk Score and Plan: Ondansetron  Airway Management Planned:   Additional Equipment:   Intra-op Plan:   Post-operative Plan:   Informed Consent: I have reviewed the patients History and Physical, chart, labs and discussed the procedure including the risks, benefits and alternatives for the proposed anesthesia with the patient or authorized representative who has indicated his/her understanding and acceptance.     Dental Advisory Given  Plan Discussed with: CRNA  Anesthesia Plan Comments:         Anesthesia Quick Evaluation

## 2021-04-26 NOTE — Op Note (Signed)
PRE-OPERATIVE DIAGNOSIS:  Adenocarcinoma of the prostate  POST-OPERATIVE DIAGNOSIS:  Same  PROCEDURE: 1. Prostate Ultrasound 2. Placement of fiducial marks 3. Placement of SpaceOAR  SURGEON:  Surgeon(s): Nicolette Bang, MD  ANESTHESIA:  General  EBL:  Minimal  DRAINS: none  FINDINGS: Prostate volume 96cc  INDICATION: Caleb Pugh is a 75 year old with a history of T1c prostate cancer who is scheduled to undergo IMRT. He wishes to have fiducial markers and SpaceOAR placed prior to IMRT to decrease rectal toxicity.  Description of procedure: After informed consent the patient was brought to the major OR, placed on the table and administered general anesthesia. He was then moved to the modified lithotomy position with his perineum perpendicular to the floor. His perineum and genitalia were then sterilely prepped. An official timeout was then performed. The transrectal ultrasound probe was placed in the rectum and affixed to the stand. He was then sterilely draped.  A transrectal ultrasound of the prostate was performed.  Lidocaine  was not  instilled using ultrasound guidance into the junction of each seminal vesicle of the prostate.  3 Gold markers were placed into the prostate using the standard template and ultrasound guidance.  Accurate placement of the markers was confirmed.  We then proceeded to mix the SpaceOAR using the kit supplied from the manufacturer. Once this was complete we placed a sinal needle into the perirectal fat between the rectum and the prostate. Once this was accomplished we injected 2cc of normal saline to hydrodissect the plain. We then instilled the the SpaceOAR through the spinal needle and noted good distribution in the perirectal fat.   The patient was awakened and taken to recovery room in stable and satisfactory condition. He tolerated procedure well and there were no intraoperative complications.  CONDITION: Stable, extubated, transferred to  PACU  PLAN: The patient is to be discharged home and he will start IMRT in the next 2-3 weeks

## 2021-04-26 NOTE — Transfer of Care (Signed)
Immediate Anesthesia Transfer of Care Note  Patient: Caleb Pugh  Procedure(s) Performed: SPACE OAR INSTILLATION (Prostate) GOLD SEED IMPLANT (Prostate)  Patient Location: PACU  Anesthesia Type:General  Level of Consciousness: awake  Airway & Oxygen Therapy: Patient Spontanous Breathing  Post-op Assessment: Report given to RN  Post vital signs: Reviewed  Last Vitals:  Vitals Value Taken Time  BP 142/72 04/26/21 0847  Temp    Pulse 83 04/26/21 0849  Resp 13 04/26/21 0849  SpO2 96 % 04/26/21 0849  Vitals shown include unvalidated device data.  Last Pain:  Vitals:   04/26/21 0701  TempSrc:   PainSc: 0-No pain      Patients Stated Pain Goal: 7 ([id number])  Complications: No notable events documented.

## 2021-04-26 NOTE — Anesthesia Postprocedure Evaluation (Signed)
Anesthesia Post Note  Patient: Caleb Pugh  Procedure(s) Performed: SPACE OAR INSTILLATION (Prostate) GOLD SEED IMPLANT (Prostate)  Patient location during evaluation: Phase II Anesthesia Type: General Level of consciousness: awake Pain management: pain level controlled Vital Signs Assessment: post-procedure vital signs reviewed and stable Respiratory status: spontaneous breathing and respiratory function stable Cardiovascular status: blood pressure returned to baseline and stable Postop Assessment: no headache and no apparent nausea or vomiting Anesthetic complications: no Comments: Late entry   No notable events documented.   Last Vitals:  Vitals:   04/26/21 0900 04/26/21 0912  BP: 133/76 127/60  Pulse: 86 85  Resp: 14 16  Temp:  36.6 C  SpO2: 92% 95%    Last Pain:  Vitals:   04/26/21 0912  TempSrc: Oral  PainSc: 0-No pain                 Louann Sjogren

## 2021-04-26 NOTE — H&P (Signed)
Urology Admission H&P  Chief Complaint: Prostate Cancer  History of Present Illness: [name] is a 75yo here for fiducial markers with SpaceOAR due to prostate cancer. NO complaints today  Past Medical History:  Diagnosis Date   BPH (benign prostatic hyperplasia)    Cancer ([place])    Diabetes mellitus without complication ([place])    High cholesterol    Hypertension    Past Surgical History:  Procedure Laterality Date   CATARACT EXTRACTION W/PHACO Left 12/11/2020   Procedure: CATARACT EXTRACTION PHACO AND INTRAOCULAR LENS PLACEMENT LEFT EYE;  Surgeon: Baruch Goldmann, MD;  Location: [facility];  Service: Ophthalmology;  Laterality: Left;  left CDE=13.96   EVALUATION UNDER ANESTHESIA WITH TEAR DUCT PROBING     TONSILLECTOMY      Home Medications:  Current Facility-Administered Medications  Medication Dose Route Frequency Provider Last Rate Last Admin   ceFAZolin (ANCEF) 2-4 GM/100ML-% IVPB            ceFAZolin (ANCEF) IVPB 2g/100 mL premix  2 g Intravenous 30 min Pre-Op Devaughn Savant, Candee Furbish, MD       lactated ringers infusion   Intravenous Continuous Louann Sjogren, MD 10 mL/hr at 04/26/21 0747 1,000 mL at 04/26/21 0747   Allergies: No Known Allergies  History reviewed. No pertinent family history. Social History:  reports that he has been smoking cigarettes. He has a 30.00 pack-year smoking history. He has never used smokeless tobacco. He reports that he does not currently use alcohol. He reports that he does not use drugs.  Review of Systems  All other systems reviewed and are negative.  Physical Exam:  Vital signs in last 24 hours: Temp:  [97.9 F (36.6 C)] 97.9 F (36.6 C) (09/08 0659) Pulse Rate:  [84] 84 (09/08 0659) Resp:  [17] 17 (09/08 0659) BP: (187)/(75) 187/75 (09/08 0701) SpO2:  [100 %] 100 % (09/08 0659) Physical Exam Vitals reviewed.  Constitutional:      Appearance: Normal appearance.  HENT:     Head: Normocephalic and atraumatic.     Nose: Nose normal.  No congestion.  Eyes:     Extraocular Movements: Extraocular movements intact.     Pupils: Pupils are equal, round, and reactive to light.  Cardiovascular:     Rate and Rhythm: Normal rate and regular rhythm.  Pulmonary:     Effort: Pulmonary effort is normal. No respiratory distress.  Abdominal:     General: Abdomen is flat. There is no distension.  Musculoskeletal:        General: No swelling. Normal range of motion.     Cervical back: Normal range of motion and neck supple.  Skin:    General: Skin is warm and dry.  Neurological:     General: No focal deficit present.     Mental Status: He is alert and oriented to person, place, and time.  Psychiatric:        Mood and Affect: Mood normal.        Behavior: Behavior normal.        Thought Content: Thought content normal.        Judgment: Judgment normal.    Laboratory Data:  No results found for this or any previous visit (from the past 24 hour(s)). No results found for this or any previous visit (from the past 240 hour(s)). Creatinine: Recent Labs    04/24/21 1132  CREATININE 0.76   Baseline Creatinine: 0.7  Impression/Assessment:  75yo with prostate cancer  Plan:  I discussed the natural  history of prostate cancer with the patient and the various treatment options including active surveillance, RALP, IMRT, brachytherapy, cryotherapy, HIFU and ADT. After discussing the options the patient elects for IMRT. We will proceed with brachytherapy with SPaceOAR. Risks/benefits/alternatives discussed   Nicolette Bang 04/26/2021, 7:48 AM

## 2021-04-27 ENCOUNTER — Encounter (HOSPITAL_COMMUNITY): Payer: Self-pay | Admitting: Urology

## 2021-05-03 DIAGNOSIS — E7800 Pure hypercholesterolemia, unspecified: Secondary | ICD-10-CM | POA: Diagnosis not present

## 2021-05-03 DIAGNOSIS — I10 Essential (primary) hypertension: Secondary | ICD-10-CM | POA: Diagnosis not present

## 2021-05-03 DIAGNOSIS — C61 Malignant neoplasm of prostate: Secondary | ICD-10-CM | POA: Diagnosis not present

## 2021-05-03 DIAGNOSIS — F17210 Nicotine dependence, cigarettes, uncomplicated: Secondary | ICD-10-CM | POA: Diagnosis not present

## 2021-05-03 DIAGNOSIS — C44111 Basal cell carcinoma of skin of unspecified eyelid, including canthus: Secondary | ICD-10-CM | POA: Diagnosis not present

## 2021-05-03 DIAGNOSIS — Z7984 Long term (current) use of oral hypoglycemic drugs: Secondary | ICD-10-CM | POA: Diagnosis not present

## 2021-05-03 DIAGNOSIS — Z79899 Other long term (current) drug therapy: Secondary | ICD-10-CM | POA: Diagnosis not present

## 2021-05-03 DIAGNOSIS — E119 Type 2 diabetes mellitus without complications: Secondary | ICD-10-CM | POA: Diagnosis not present

## 2021-05-09 DIAGNOSIS — Z189 Retained foreign body fragments, unspecified material: Secondary | ICD-10-CM | POA: Diagnosis not present

## 2021-05-09 DIAGNOSIS — S0035XD Superficial foreign body of nose, subsequent encounter: Secondary | ICD-10-CM | POA: Diagnosis not present

## 2021-05-09 DIAGNOSIS — Z09 Encounter for follow-up examination after completed treatment for conditions other than malignant neoplasm: Secondary | ICD-10-CM | POA: Diagnosis not present

## 2021-05-09 DIAGNOSIS — C441121 Basal cell carcinoma of skin of right upper eyelid, including canthus: Secondary | ICD-10-CM | POA: Diagnosis not present

## 2021-05-09 DIAGNOSIS — C441122 Basal cell carcinoma of skin of right lower eyelid, including canthus: Secondary | ICD-10-CM | POA: Diagnosis not present

## 2021-05-09 DIAGNOSIS — C441191 Basal cell carcinoma of skin of left upper eyelid, including canthus: Secondary | ICD-10-CM | POA: Diagnosis not present

## 2021-05-09 DIAGNOSIS — J342 Deviated nasal septum: Secondary | ICD-10-CM | POA: Diagnosis not present

## 2021-05-10 ENCOUNTER — Inpatient Hospital Stay: Payer: Medicare Other | Attending: Oncology

## 2021-05-10 ENCOUNTER — Other Ambulatory Visit: Payer: Self-pay

## 2021-05-10 ENCOUNTER — Inpatient Hospital Stay: Payer: Medicare Other | Admitting: Oncology

## 2021-05-10 VITALS — BP 148/71 | HR 90 | Temp 97.0°F | Resp 18 | Ht 72.0 in | Wt 176.8 lb

## 2021-05-10 DIAGNOSIS — Z79899 Other long term (current) drug therapy: Secondary | ICD-10-CM | POA: Insufficient documentation

## 2021-05-10 DIAGNOSIS — C61 Malignant neoplasm of prostate: Secondary | ICD-10-CM

## 2021-05-10 DIAGNOSIS — C44111 Basal cell carcinoma of skin of unspecified eyelid, including canthus: Secondary | ICD-10-CM

## 2021-05-10 DIAGNOSIS — C441121 Basal cell carcinoma of skin of right upper eyelid, including canthus: Secondary | ICD-10-CM | POA: Diagnosis not present

## 2021-05-10 LAB — CMP (CANCER CENTER ONLY)
ALT: 17 U/L (ref 0–44)
AST: 17 U/L (ref 15–41)
Albumin: 3.9 g/dL (ref 3.5–5.0)
Alkaline Phosphatase: 74 U/L (ref 38–126)
Anion gap: 12 (ref 5–15)
BUN: 19 mg/dL (ref 8–23)
CO2: 26 mmol/L (ref 22–32)
Calcium: 9.4 mg/dL (ref 8.9–10.3)
Chloride: 99 mmol/L (ref 98–111)
Creatinine: 1.07 mg/dL (ref 0.61–1.24)
GFR, Estimated: >60 mL/min (ref >60)
Glucose, Bld: 187 mg/dL — ABNORMAL HIGH (ref 70–99)
Potassium: 3.8 mmol/L (ref 3.5–5.1)
Sodium: 137 mmol/L (ref 135–145)
Total Bilirubin: 0.4 mg/dL (ref 0.3–1.2)
Total Protein: 7.7 g/dL (ref 6.5–8.1)

## 2021-05-10 LAB — CBC WITH DIFFERENTIAL (CANCER CENTER ONLY)
Abs Immature Granulocytes: 0.06 10*3/uL (ref 0.00–0.07)
Basophils Absolute: 0 10*3/uL (ref 0.0–0.1)
Basophils Relative: 0 %
Eosinophils Absolute: 0.1 10*3/uL (ref 0.0–0.5)
Eosinophils Relative: 1 %
HCT: 40.9 % (ref 39.0–52.0)
Hemoglobin: 14.2 g/dL (ref 13.0–17.0)
Immature Granulocytes: 1 %
Lymphocytes Relative: 21 %
Lymphs Abs: 1.8 10*3/uL (ref 0.7–4.0)
MCH: 30.5 pg (ref 26.0–34.0)
MCHC: 34.7 g/dL (ref 30.0–36.0)
MCV: 87.8 fL (ref 80.0–100.0)
Monocytes Absolute: 0.4 10*3/uL (ref 0.1–1.0)
Monocytes Relative: 5 %
Neutro Abs: 6.4 10*3/uL (ref 1.7–7.7)
Neutrophils Relative %: 72 %
Platelet Count: 201 10*3/uL (ref 150–400)
RBC: 4.66 MIL/uL (ref 4.22–5.81)
RDW: 13.3 % (ref 11.5–15.5)
WBC Count: 8.9 10*3/uL (ref 4.0–10.5)
nRBC: 0 % (ref 0.0–0.2)

## 2021-05-10 NOTE — Progress Notes (Signed)
Hematology and Oncology Follow Up Visit  Caleb Pugh [id number] Dec 24, 1945 75 y.o. 05/10/2021 9:00 AM Caleb Pugh, MDShah, Caleb Picking, MD   Principle Diagnosis: 75 year old man:   Prostate cancer diagnosed in May 2022.  Gleason score is 4+4 = 8 in 2 cores.  PSA is 7.4.  2.    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   Prior therapy: Erivedge 150 mg daily started in May 2022.  Therapy discontinued due to lack of response.   Current therapy: Under evaluation for definitive therapy for his prostate cancer with radiation and androgen deprivation.  Interim History: Caleb Pugh presents today for a follow-up visit.  Since last visit, he underwent placement of fiducial markers in preparation for radiation therapy under the care of Dr. Alyson Pugh and Dr. Lynnette Pugh.  He reports feeling well without any major complaints.  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     Medications: Updated on review. Current Outpatient Medications  Medication Sig Dispense Refill   ACCU-CHEK GUIDE test strip      alfuzosin (UROXATRAL) 10 MG 24 hr tablet Take 1 tablet (10 mg total) by mouth at bedtime. (Patient taking differently: Take 10 mg by mouth daily with supper.) 30 tablet 11   famotidine (PEPCID) 20 MG tablet Take 20 mg by mouth at bedtime as needed for heartburn or indigestion.     lisinopril-hydrochlorothiazide (ZESTORETIC) 20-12.5 MG tablet Take 2 tablets by mouth daily.     metFORMIN (GLUCOPHAGE) 500 MG tablet Take 500 mg by mouth daily.     naproxen sodium (ALEVE) 220 MG tablet Take 220 mg by mouth daily as needed (pain).     simvastatin (ZOCOR) 40 MG tablet Take 40 mg by mouth at bedtime.     vismodegib (ERIVEDGE) 150 MG capsule Take 1 capsule (150 mg total) by mouth daily. 30 capsule 3   No current facility-administered medications for this visit.     Allergies: No Known Allergies    Physical  Exam: Blood pressure (!) 148/71, pulse 90, temperature (!) 97 F (36.1 C), temperature source Tympanic, resp. rate 18, height 6' (1.829 m), weight 176 lb 12.8 oz (80.2 kg), SpO2 99 %.  ECOG: 1   General appearance: Alert, awake without any distress. Head: Atraumatic without abnormalities Oropharynx: Without any thrush or ulcers. Eyes: Right medial canthus mass noted.  Not dramatically different from previous examination. Lymph nodes: No lymphadenopathy noted in the cervical, supraclavicular, or axillary nodes Heart:regular rate and rhythm, without any murmurs or gallops.   Lung: Clear to auscultation without any rhonchi, wheezes or dullness to percussion. Abdomin: Soft, nontender without any shifting dullness or ascites. Musculoskeletal: No clubbing or cyanosis. Neurological: No motor or sensory deficits. Skin: No rashes or lesions.      Lab Results: Lab Results  Component Value Date   WBC 8.9 05/10/2021   HGB 14.2 05/10/2021   HCT 40.9 05/10/2021   MCV 87.8 05/10/2021   PLT 201 05/10/2021     Chemistry      Component Value Date/Time   NA 136 04/24/2021 1132   NA 138 01/17/2021 1630   K 3.8 04/24/2021 1132   CL 100 04/24/2021 1132   CO2 26 04/24/2021 1132   BUN 19 04/24/2021 1132   BUN 20 01/17/2021 1630   CREATININE 0.76 04/24/2021 1132   CREATININE 1.12 02/07/2021 1445      Component Value Date/Time   CALCIUM 9.1  04/24/2021 1132   ALKPHOS 70 02/07/2021 1445   AST 18 02/07/2021 1445   ALT 17 02/07/2021 1445   BILITOT 0.3 02/07/2021 1445          Impression and Plan:  75 year old with:    1.  Prostate cancer diagnosed in May 2022.  He was found to have PSA of 7 and has a Gleason score 4+4 = 8.    His disease status was updated at this time and his case was discussed with Dr. Lynnette Pugh.  I agree with that definitive treatment with radiation and androgen deprivation therapy.  Therapy escalation with androgen receptor pathway inhibitors or systemic  chemotherapy has been deferred at this time.       2.  Basal cell carcinoma of the right medial canthus diagnosed in April 2022.  He is currently under evaluation for possible surgical resection and joint reconstruction between Dr. Leonard Schwartz and Dr. Claudia Desanctis from plastics versus radiation therapy.   Given the consideration for radiation therapy, I recommended discontinuation of Erivedge for the time being.    3.  Follow-up: In 6 months to follow his progress.     30  minutes were dedicated to this visit.  The time was spent on reviewing his disease status, discussing treatment options with different consulting physicians as well as future plan of care discussion.        Zola Button, MD 9/22/20229:00 AM

## 2021-05-11 ENCOUNTER — Ambulatory Visit: Payer: Medicare Other | Admitting: Urology

## 2021-05-11 ENCOUNTER — Encounter: Payer: Self-pay | Admitting: Urology

## 2021-05-11 VITALS — BP 164/77 | HR 87 | Temp 97.8°F | Ht 72.0 in | Wt 179.2 lb

## 2021-05-11 DIAGNOSIS — R351 Nocturia: Secondary | ICD-10-CM

## 2021-05-11 DIAGNOSIS — R339 Retention of urine, unspecified: Secondary | ICD-10-CM

## 2021-05-11 DIAGNOSIS — C61 Malignant neoplasm of prostate: Secondary | ICD-10-CM

## 2021-05-11 DIAGNOSIS — N138 Other obstructive and reflux uropathy: Secondary | ICD-10-CM | POA: Diagnosis not present

## 2021-05-11 DIAGNOSIS — N401 Enlarged prostate with lower urinary tract symptoms: Secondary | ICD-10-CM

## 2021-05-11 LAB — URINALYSIS, ROUTINE W REFLEX MICROSCOPIC
Bilirubin, UA: NEGATIVE
Ketones, UA: NEGATIVE
Leukocytes,UA: NEGATIVE
Nitrite, UA: NEGATIVE
Protein,UA: NEGATIVE
Specific Gravity, UA: 1.015 (ref 1.005–1.030)
Urobilinogen, Ur: 0.2 mg/dL (ref 0.2–1.0)
pH, UA: 6 (ref 5.0–7.5)

## 2021-05-11 LAB — MICROSCOPIC EXAMINATION
Bacteria, UA: NONE SEEN
RBC, Urine: >30 /hpf — AB (ref 0–2)
Renal Epithel, UA: NONE SEEN /hpf
WBC, UA: NONE SEEN /hpf (ref 0–5)

## 2021-05-11 MED ORDER — ALFUZOSIN HCL ER 10 MG PO TB24: 10.0000 mg | ORAL_TABLET | Freq: Every day | ORAL | 3 refills | Status: DC

## 2021-05-11 NOTE — Patient Instructions (Signed)
Benign Prostatic Hyperplasia Benign prostatic hyperplasia (BPH) is an enlarged prostate gland that is caused by the normal aging process and not by cancer. The prostate is a walnut-sized gland that is involved in the production of semen. It is located in front of the rectum and below the bladder. The bladder stores urine and the urethra is the tube that carries the urine out of the body. The prostate may get bigger as a man gets older. An enlarged prostate can press on the urethra. This can make it harder to pass urine. The build-up of urine in the bladder can cause infection. Back pressure and infection may progress to bladder damage and kidney (renal) failure. What are the causes? This condition is part of a normal aging process. However, not all men develop problems from this condition. If the prostate enlarges away from the urethra, urine flow will not be blocked. If it enlarges toward the urethra and compresses it, there will be problems passing urine. What increases the risk? This condition is more likely to develop in men over the age of 50 years. What are the signs or symptoms? Symptoms of this condition include: Getting up often during the night to urinate. Needing to urinate frequently during the day. Difficulty starting urine flow. Decrease in size and strength of your urine stream. Leaking (dribbling) after urinating. Inability to pass urine. This needs immediate treatment. Inability to completely empty your bladder. Pain when you pass urine. This is more common if there is also an infection. Urinary tract infection (UTI). How is this diagnosed? This condition is diagnosed based on your medical history, a physical exam, and your symptoms. Tests will also be done, such as: A post-void bladder scan. This measures any amount of urine that may remain in your bladder after you finish urinating. A digital rectal exam. In a rectal exam, your health care provider checks your prostate by  putting a lubricated, gloved finger into your rectum to feel the back of your prostate gland. This exam detects the size of your gland and any abnormal lumps or growths. An exam of your urine (urinalysis). A prostate specific antigen (PSA) screening. This is a blood test used to screen for prostate cancer. An ultrasound. This test uses sound waves to electronically produce a picture of your prostate gland. Your health care provider may refer you to a specialist in kidney and prostate diseases (urologist). How is this treated? Once symptoms begin, your health care provider will monitor your condition (active surveillance or watchful waiting). Treatment for this condition will depend on the severity of your condition. Treatment may include: Observation and yearly exams. This may be the only treatment needed if your condition and symptoms are mild. Medicines to relieve your symptoms, including: Medicines to shrink the prostate. Medicines to relax the muscle of the prostate. Surgery in severe cases. Surgery may include: Prostatectomy. In this procedure, the prostate tissue is removed completely through an open incision or with a laparoscope or robotics. Transurethral resection of the prostate (TURP). In this procedure, a tool is inserted through the opening at the tip of the penis (urethra). It is used to cut away tissue of the inner core of the prostate. The pieces are removed through the same opening of the penis. This removes the blockage. Transurethral incision (TUIP). In this procedure, small cuts are made in the prostate. This lessens the prostate's pressure on the urethra. Transurethral microwave thermotherapy (TUMT). This procedure uses microwaves to create heat. The heat destroys and removes a   small amount of prostate tissue. Transurethral needle ablation (TUNA). This procedure uses radio frequencies to destroy and remove a small amount of prostate tissue. Interstitial laser coagulation (ILC).  This procedure uses a laser to destroy and remove a small amount of prostate tissue. Transurethral electrovaporization (TUVP). This procedure uses electrodes to destroy and remove a small amount of prostate tissue. Prostatic urethral lift. This procedure inserts an implant to push the lobes of the prostate away from the urethra. Follow these instructions at home: Take over-the-counter and prescription medicines only as told by your health care provider. Monitor your symptoms for any changes. Contact your health care provider with any changes. Avoid drinking large amounts of liquid before going to bed or out in public. Avoid or reduce how much caffeine or alcohol you drink. Give yourself time when you urinate. Keep all follow-up visits as told by your health care provider. This is important. Contact a health care provider if: You have unexplained back pain. Your symptoms do not get better with treatment. You develop side effects from the medicine you are taking. Your urine becomes very dark or has a bad smell. Your lower abdomen becomes distended and you have trouble passing your urine. Get help right away if: You have a fever or chills. You suddenly cannot urinate. You feel lightheaded, or very dizzy, or you faint. There are large amounts of blood or clots in the urine. Your urinary problems become hard to manage. You develop moderate to severe low back or flank pain. The flank is the side of your body between the ribs and the hip. These symptoms may represent a serious problem that is an emergency. Do not wait to see if the symptoms will go away. Get medical help right away. Call your local emergency services (911 in the [place] Do not drive yourself to the hospital. Summary Benign prostatic hyperplasia (BPH) is an enlarged prostate that is caused by the normal aging process and not by cancer. An enlarged prostate can press on the urethra. This can make it hard to pass urine. This  condition is part of a normal aging process and is more likely to develop in men over the age of 50 years. Get help right away if you suddenly cannot urinate. This information is not intended to replace advice given to you by your health care provider. Make sure you discuss any questions you have with your health care provider. Document Revised: 11/15/2020 Document Reviewed: 04/13/2020 Elsevier Patient Education  2022 Elsevier Inc.  

## 2021-05-11 NOTE — Progress Notes (Signed)
05/11/2021 11:19 AM   Caleb Pugh 05-Jul-1946 [id number]  Referring provider: Monico Blitz, [address] [place],  [place] [address]  Followup BPH with nocturia   HPI: Caleb Pugh is a 75yo here for followup for BPH and nocturia. He has been doing fair after fiducial markers with SpaceOAR from a urination standpoint. IPSS 20, QOL 4. Nocturia 3-4x, urine stream weak. He has intermittent straining to urinate and hesitation of urination. No dysuria or hematuria.    PMH: Past Medical History:  Diagnosis Date   BPH (benign prostatic hyperplasia)    Cancer ([place])    Diabetes mellitus without complication ([place])    High cholesterol    Hypertension     Surgical History: Past Surgical History:  Procedure Laterality Date   CATARACT EXTRACTION W/PHACO Left 12/11/2020   Procedure: CATARACT EXTRACTION PHACO AND INTRAOCULAR LENS PLACEMENT LEFT EYE;  Surgeon: Baruch Goldmann, MD;  Location: [facility];  Service: Ophthalmology;  Laterality: Left;  left CDE=13.96   EVALUATION UNDER ANESTHESIA WITH TEAR DUCT PROBING     GOLD SEED IMPLANT N/A 04/26/2021   Procedure: GOLD SEED IMPLANT;  Surgeon: Cleon Gustin, MD;  Location: [facility];  Service: Urology;  Laterality: N/A;   SPACE OAR INSTILLATION N/A 04/26/2021   Procedure: SPACE OAR INSTILLATION;  Surgeon: Cleon Gustin, MD;  Location: [facility];  Service: Urology;  Laterality: N/A;   TONSILLECTOMY      Home Medications:  Allergies as of 05/11/2021   No Known Allergies      Medication List        Accurate as of May 11, 2021 11:19 AM. If you have any questions, ask your nurse or doctor.          STOP taking these medications    vismodegib 150 MG capsule Commonly known as: ERIVEDGE       TAKE these medications    Accu-Chek Guide test strip Generic drug: glucose blood   alfuzosin 10 MG 24 hr tablet Commonly known as: UROXATRAL Take 1 tablet (10 mg total) by mouth at bedtime. What changed: when to take this    famotidine 20 MG tablet Commonly known as: PEPCID Take 20 mg by mouth at bedtime as needed for heartburn or indigestion.   lisinopril-hydrochlorothiazide 20-12.5 MG tablet Commonly known as: ZESTORETIC Take 2 tablets by mouth daily.   metFORMIN 500 MG tablet Commonly known as: GLUCOPHAGE Take 500 mg by mouth daily.   naproxen sodium 220 MG tablet Commonly known as: ALEVE Take 220 mg by mouth daily as needed (pain).   simvastatin 40 MG tablet Commonly known as: ZOCOR Take 40 mg by mouth at bedtime.        Allergies: No Known Allergies  Family History: No family history on file.  Social History:  reports that he has been smoking cigarettes. He has a 30.00 pack-year smoking history. He has never used smokeless tobacco. He reports that he does not currently use alcohol. He reports that he does not use drugs.  ROS: All other review of systems were reviewed and are negative except what is noted above in HPI  Physical Exam: BP (!) 164/77   Pulse 87   Temp 97.8 F (36.6 C)   Ht 6' (1.829 m)   Wt 179 lb 3.2 oz (81.3 kg)   BMI 24.30 kg/m   Constitutional:  Alert and oriented, No acute distress. HEENT: [place] AT, moist mucus membranes.  Trachea midline, no masses. Cardiovascular: No clubbing, cyanosis, or edema. Respiratory: Normal  respiratory effort, no increased work of breathing. GI: Abdomen is soft, nontender, nondistended, no abdominal masses GU: No CVA tenderness.  Lymph: No cervical or inguinal lymphadenopathy. Skin: No rashes, bruises or suspicious lesions. Neurologic: Grossly intact, no focal deficits, moving all 4 extremities. Psychiatric: Normal mood and affect.  Laboratory Data: Lab Results  Component Value Date   WBC 8.9 05/10/2021   HGB 14.2 05/10/2021   HCT 40.9 05/10/2021   MCV 87.8 05/10/2021   PLT 201 05/10/2021    Lab Results  Component Value Date   CREATININE 1.07 05/10/2021    No results found for: PSA  No results found for:  TESTOSTERONE  Lab Results  Component Value Date   HGBA1C 7.2 (H) 04/24/2021    Urinalysis    Component Value Date/Time   APPEARANCEUR Clear 03/02/2021 1235   GLUCOSEU Negative 03/02/2021 1235   BILIRUBINUR Negative 03/02/2021 1235   PROTEINUR Negative 03/02/2021 1235   NITRITE Negative 03/02/2021 1235   LEUKOCYTESUR Negative 03/02/2021 1235    Lab Results  Component Value Date   LABMICR See below: 03/02/2021   WBCUA None seen 03/02/2021   LABEPIT None seen 03/02/2021   MUCUS Present 03/02/2021   BACTERIA None seen 03/02/2021    Pertinent Imaging:  No results found for this or any previous visit.  No results found for this or any previous visit.  No results found for this or any previous visit.  No results found for this or any previous visit.  No results found for this or any previous visit.  No results found for this or any previous visit.  No results found for this or any previous visit.  No results found for this or any previous visit.   Assessment & Plan:    1. Prostate cancer ([place]) -RTC 5 months with PSa and for eligard 45mg  - Urinalysis, Routine w reflex microscopic  2. Benign prostatic hyperplasia with urinary obstruction -continue uroxatral 10mg  qhs   No follow-ups on file.  Nicolette Bang, MD  [facility] [place]

## 2021-05-11 NOTE — Progress Notes (Signed)

## 2021-05-18 DIAGNOSIS — I10 Essential (primary) hypertension: Secondary | ICD-10-CM | POA: Diagnosis not present

## 2021-05-18 DIAGNOSIS — C44101 Unspecified malignant neoplasm of skin of unspecified eyelid, including canthus: Secondary | ICD-10-CM | POA: Diagnosis not present

## 2021-05-18 DIAGNOSIS — K219 Gastro-esophageal reflux disease without esophagitis: Secondary | ICD-10-CM | POA: Diagnosis not present

## 2021-05-21 DIAGNOSIS — Z7984 Long term (current) use of oral hypoglycemic drugs: Secondary | ICD-10-CM | POA: Diagnosis not present

## 2021-05-21 DIAGNOSIS — Z79899 Other long term (current) drug therapy: Secondary | ICD-10-CM | POA: Diagnosis not present

## 2021-05-21 DIAGNOSIS — I10 Essential (primary) hypertension: Secondary | ICD-10-CM | POA: Diagnosis not present

## 2021-05-21 DIAGNOSIS — F17210 Nicotine dependence, cigarettes, uncomplicated: Secondary | ICD-10-CM | POA: Diagnosis not present

## 2021-05-21 DIAGNOSIS — E7800 Pure hypercholesterolemia, unspecified: Secondary | ICD-10-CM | POA: Diagnosis not present

## 2021-05-21 DIAGNOSIS — C44111 Basal cell carcinoma of skin of unspecified eyelid, including canthus: Secondary | ICD-10-CM | POA: Diagnosis not present

## 2021-05-21 DIAGNOSIS — E119 Type 2 diabetes mellitus without complications: Secondary | ICD-10-CM | POA: Diagnosis not present

## 2021-05-22 DIAGNOSIS — E119 Type 2 diabetes mellitus without complications: Secondary | ICD-10-CM | POA: Diagnosis not present

## 2021-05-22 DIAGNOSIS — Z7984 Long term (current) use of oral hypoglycemic drugs: Secondary | ICD-10-CM | POA: Diagnosis not present

## 2021-05-22 DIAGNOSIS — F17210 Nicotine dependence, cigarettes, uncomplicated: Secondary | ICD-10-CM | POA: Diagnosis not present

## 2021-05-22 DIAGNOSIS — E7800 Pure hypercholesterolemia, unspecified: Secondary | ICD-10-CM | POA: Diagnosis not present

## 2021-05-22 DIAGNOSIS — Z79899 Other long term (current) drug therapy: Secondary | ICD-10-CM | POA: Diagnosis not present

## 2021-05-22 DIAGNOSIS — I10 Essential (primary) hypertension: Secondary | ICD-10-CM | POA: Diagnosis not present

## 2021-05-22 DIAGNOSIS — C44111 Basal cell carcinoma of skin of unspecified eyelid, including canthus: Secondary | ICD-10-CM | POA: Diagnosis not present

## 2021-05-23 DIAGNOSIS — Z7984 Long term (current) use of oral hypoglycemic drugs: Secondary | ICD-10-CM | POA: Diagnosis not present

## 2021-05-23 DIAGNOSIS — I10 Essential (primary) hypertension: Secondary | ICD-10-CM | POA: Diagnosis not present

## 2021-05-23 DIAGNOSIS — F17210 Nicotine dependence, cigarettes, uncomplicated: Secondary | ICD-10-CM | POA: Diagnosis not present

## 2021-05-23 DIAGNOSIS — E7800 Pure hypercholesterolemia, unspecified: Secondary | ICD-10-CM | POA: Diagnosis not present

## 2021-05-23 DIAGNOSIS — Z79899 Other long term (current) drug therapy: Secondary | ICD-10-CM | POA: Diagnosis not present

## 2021-05-23 DIAGNOSIS — E119 Type 2 diabetes mellitus without complications: Secondary | ICD-10-CM | POA: Diagnosis not present

## 2021-05-23 DIAGNOSIS — C44111 Basal cell carcinoma of skin of unspecified eyelid, including canthus: Secondary | ICD-10-CM | POA: Diagnosis not present

## 2021-05-24 DIAGNOSIS — C44111 Basal cell carcinoma of skin of unspecified eyelid, including canthus: Secondary | ICD-10-CM | POA: Diagnosis not present

## 2021-05-24 DIAGNOSIS — Z7984 Long term (current) use of oral hypoglycemic drugs: Secondary | ICD-10-CM | POA: Diagnosis not present

## 2021-05-24 DIAGNOSIS — E7800 Pure hypercholesterolemia, unspecified: Secondary | ICD-10-CM | POA: Diagnosis not present

## 2021-05-24 DIAGNOSIS — E119 Type 2 diabetes mellitus without complications: Secondary | ICD-10-CM | POA: Diagnosis not present

## 2021-05-24 DIAGNOSIS — F17210 Nicotine dependence, cigarettes, uncomplicated: Secondary | ICD-10-CM | POA: Diagnosis not present

## 2021-05-24 DIAGNOSIS — I10 Essential (primary) hypertension: Secondary | ICD-10-CM | POA: Diagnosis not present

## 2021-05-24 DIAGNOSIS — Z79899 Other long term (current) drug therapy: Secondary | ICD-10-CM | POA: Diagnosis not present

## 2021-05-25 DIAGNOSIS — E7800 Pure hypercholesterolemia, unspecified: Secondary | ICD-10-CM | POA: Diagnosis not present

## 2021-05-25 DIAGNOSIS — Z79899 Other long term (current) drug therapy: Secondary | ICD-10-CM | POA: Diagnosis not present

## 2021-05-25 DIAGNOSIS — F17210 Nicotine dependence, cigarettes, uncomplicated: Secondary | ICD-10-CM | POA: Diagnosis not present

## 2021-05-25 DIAGNOSIS — C44111 Basal cell carcinoma of skin of unspecified eyelid, including canthus: Secondary | ICD-10-CM | POA: Diagnosis not present

## 2021-05-25 DIAGNOSIS — I10 Essential (primary) hypertension: Secondary | ICD-10-CM | POA: Diagnosis not present

## 2021-05-25 DIAGNOSIS — Z7984 Long term (current) use of oral hypoglycemic drugs: Secondary | ICD-10-CM | POA: Diagnosis not present

## 2021-05-25 DIAGNOSIS — E119 Type 2 diabetes mellitus without complications: Secondary | ICD-10-CM | POA: Diagnosis not present

## 2021-05-28 DIAGNOSIS — F17210 Nicotine dependence, cigarettes, uncomplicated: Secondary | ICD-10-CM | POA: Diagnosis not present

## 2021-05-28 DIAGNOSIS — C44111 Basal cell carcinoma of skin of unspecified eyelid, including canthus: Secondary | ICD-10-CM | POA: Diagnosis not present

## 2021-05-28 DIAGNOSIS — Z79899 Other long term (current) drug therapy: Secondary | ICD-10-CM | POA: Diagnosis not present

## 2021-05-28 DIAGNOSIS — Z7984 Long term (current) use of oral hypoglycemic drugs: Secondary | ICD-10-CM | POA: Diagnosis not present

## 2021-05-28 DIAGNOSIS — E7800 Pure hypercholesterolemia, unspecified: Secondary | ICD-10-CM | POA: Diagnosis not present

## 2021-05-28 DIAGNOSIS — E119 Type 2 diabetes mellitus without complications: Secondary | ICD-10-CM | POA: Diagnosis not present

## 2021-05-28 DIAGNOSIS — I10 Essential (primary) hypertension: Secondary | ICD-10-CM | POA: Diagnosis not present

## 2021-05-29 DIAGNOSIS — C44111 Basal cell carcinoma of skin of unspecified eyelid, including canthus: Secondary | ICD-10-CM | POA: Diagnosis not present

## 2021-05-29 DIAGNOSIS — F17210 Nicotine dependence, cigarettes, uncomplicated: Secondary | ICD-10-CM | POA: Diagnosis not present

## 2021-05-29 DIAGNOSIS — E7800 Pure hypercholesterolemia, unspecified: Secondary | ICD-10-CM | POA: Diagnosis not present

## 2021-05-29 DIAGNOSIS — I10 Essential (primary) hypertension: Secondary | ICD-10-CM | POA: Diagnosis not present

## 2021-05-29 DIAGNOSIS — Z79899 Other long term (current) drug therapy: Secondary | ICD-10-CM | POA: Diagnosis not present

## 2021-05-29 DIAGNOSIS — E119 Type 2 diabetes mellitus without complications: Secondary | ICD-10-CM | POA: Diagnosis not present

## 2021-05-29 DIAGNOSIS — Z7984 Long term (current) use of oral hypoglycemic drugs: Secondary | ICD-10-CM | POA: Diagnosis not present

## 2021-05-30 DIAGNOSIS — Z7984 Long term (current) use of oral hypoglycemic drugs: Secondary | ICD-10-CM | POA: Diagnosis not present

## 2021-05-30 DIAGNOSIS — E119 Type 2 diabetes mellitus without complications: Secondary | ICD-10-CM | POA: Diagnosis not present

## 2021-05-30 DIAGNOSIS — F17210 Nicotine dependence, cigarettes, uncomplicated: Secondary | ICD-10-CM | POA: Diagnosis not present

## 2021-05-30 DIAGNOSIS — E7800 Pure hypercholesterolemia, unspecified: Secondary | ICD-10-CM | POA: Diagnosis not present

## 2021-05-30 DIAGNOSIS — Z79899 Other long term (current) drug therapy: Secondary | ICD-10-CM | POA: Diagnosis not present

## 2021-05-30 DIAGNOSIS — I10 Essential (primary) hypertension: Secondary | ICD-10-CM | POA: Diagnosis not present

## 2021-05-30 DIAGNOSIS — C44111 Basal cell carcinoma of skin of unspecified eyelid, including canthus: Secondary | ICD-10-CM | POA: Diagnosis not present

## 2021-05-31 DIAGNOSIS — C44111 Basal cell carcinoma of skin of unspecified eyelid, including canthus: Secondary | ICD-10-CM | POA: Diagnosis not present

## 2021-05-31 DIAGNOSIS — E119 Type 2 diabetes mellitus without complications: Secondary | ICD-10-CM | POA: Diagnosis not present

## 2021-05-31 DIAGNOSIS — I10 Essential (primary) hypertension: Secondary | ICD-10-CM | POA: Diagnosis not present

## 2021-05-31 DIAGNOSIS — E7800 Pure hypercholesterolemia, unspecified: Secondary | ICD-10-CM | POA: Diagnosis not present

## 2021-05-31 DIAGNOSIS — Z7984 Long term (current) use of oral hypoglycemic drugs: Secondary | ICD-10-CM | POA: Diagnosis not present

## 2021-05-31 DIAGNOSIS — F17210 Nicotine dependence, cigarettes, uncomplicated: Secondary | ICD-10-CM | POA: Diagnosis not present

## 2021-05-31 DIAGNOSIS — Z79899 Other long term (current) drug therapy: Secondary | ICD-10-CM | POA: Diagnosis not present

## 2021-06-01 DIAGNOSIS — I10 Essential (primary) hypertension: Secondary | ICD-10-CM | POA: Diagnosis not present

## 2021-06-01 DIAGNOSIS — C44111 Basal cell carcinoma of skin of unspecified eyelid, including canthus: Secondary | ICD-10-CM | POA: Diagnosis not present

## 2021-06-01 DIAGNOSIS — E119 Type 2 diabetes mellitus without complications: Secondary | ICD-10-CM | POA: Diagnosis not present

## 2021-06-01 DIAGNOSIS — F17210 Nicotine dependence, cigarettes, uncomplicated: Secondary | ICD-10-CM | POA: Diagnosis not present

## 2021-06-01 DIAGNOSIS — Z7984 Long term (current) use of oral hypoglycemic drugs: Secondary | ICD-10-CM | POA: Diagnosis not present

## 2021-06-01 DIAGNOSIS — Z79899 Other long term (current) drug therapy: Secondary | ICD-10-CM | POA: Diagnosis not present

## 2021-06-01 DIAGNOSIS — E7800 Pure hypercholesterolemia, unspecified: Secondary | ICD-10-CM | POA: Diagnosis not present

## 2021-06-04 DIAGNOSIS — I10 Essential (primary) hypertension: Secondary | ICD-10-CM | POA: Diagnosis not present

## 2021-06-04 DIAGNOSIS — E7800 Pure hypercholesterolemia, unspecified: Secondary | ICD-10-CM | POA: Diagnosis not present

## 2021-06-04 DIAGNOSIS — E119 Type 2 diabetes mellitus without complications: Secondary | ICD-10-CM | POA: Diagnosis not present

## 2021-06-04 DIAGNOSIS — C44111 Basal cell carcinoma of skin of unspecified eyelid, including canthus: Secondary | ICD-10-CM | POA: Diagnosis not present

## 2021-06-04 DIAGNOSIS — Z79899 Other long term (current) drug therapy: Secondary | ICD-10-CM | POA: Diagnosis not present

## 2021-06-04 DIAGNOSIS — Z7984 Long term (current) use of oral hypoglycemic drugs: Secondary | ICD-10-CM | POA: Diagnosis not present

## 2021-06-04 DIAGNOSIS — F17210 Nicotine dependence, cigarettes, uncomplicated: Secondary | ICD-10-CM | POA: Diagnosis not present

## 2021-06-05 DIAGNOSIS — E119 Type 2 diabetes mellitus without complications: Secondary | ICD-10-CM | POA: Diagnosis not present

## 2021-06-05 DIAGNOSIS — C44111 Basal cell carcinoma of skin of unspecified eyelid, including canthus: Secondary | ICD-10-CM | POA: Diagnosis not present

## 2021-06-05 DIAGNOSIS — F17210 Nicotine dependence, cigarettes, uncomplicated: Secondary | ICD-10-CM | POA: Diagnosis not present

## 2021-06-05 DIAGNOSIS — I10 Essential (primary) hypertension: Secondary | ICD-10-CM | POA: Diagnosis not present

## 2021-06-05 DIAGNOSIS — Z7984 Long term (current) use of oral hypoglycemic drugs: Secondary | ICD-10-CM | POA: Diagnosis not present

## 2021-06-05 DIAGNOSIS — Z79899 Other long term (current) drug therapy: Secondary | ICD-10-CM | POA: Diagnosis not present

## 2021-06-05 DIAGNOSIS — E7800 Pure hypercholesterolemia, unspecified: Secondary | ICD-10-CM | POA: Diagnosis not present

## 2021-06-06 DIAGNOSIS — E7800 Pure hypercholesterolemia, unspecified: Secondary | ICD-10-CM | POA: Diagnosis not present

## 2021-06-06 DIAGNOSIS — F17210 Nicotine dependence, cigarettes, uncomplicated: Secondary | ICD-10-CM | POA: Diagnosis not present

## 2021-06-06 DIAGNOSIS — Z79899 Other long term (current) drug therapy: Secondary | ICD-10-CM | POA: Diagnosis not present

## 2021-06-06 DIAGNOSIS — Z7984 Long term (current) use of oral hypoglycemic drugs: Secondary | ICD-10-CM | POA: Diagnosis not present

## 2021-06-06 DIAGNOSIS — I10 Essential (primary) hypertension: Secondary | ICD-10-CM | POA: Diagnosis not present

## 2021-06-06 DIAGNOSIS — C44111 Basal cell carcinoma of skin of unspecified eyelid, including canthus: Secondary | ICD-10-CM | POA: Diagnosis not present

## 2021-06-06 DIAGNOSIS — E119 Type 2 diabetes mellitus without complications: Secondary | ICD-10-CM | POA: Diagnosis not present

## 2021-06-07 DIAGNOSIS — Z79899 Other long term (current) drug therapy: Secondary | ICD-10-CM | POA: Diagnosis not present

## 2021-06-07 DIAGNOSIS — Z7984 Long term (current) use of oral hypoglycemic drugs: Secondary | ICD-10-CM | POA: Diagnosis not present

## 2021-06-07 DIAGNOSIS — I10 Essential (primary) hypertension: Secondary | ICD-10-CM | POA: Diagnosis not present

## 2021-06-07 DIAGNOSIS — E7800 Pure hypercholesterolemia, unspecified: Secondary | ICD-10-CM | POA: Diagnosis not present

## 2021-06-07 DIAGNOSIS — F17210 Nicotine dependence, cigarettes, uncomplicated: Secondary | ICD-10-CM | POA: Diagnosis not present

## 2021-06-07 DIAGNOSIS — E119 Type 2 diabetes mellitus without complications: Secondary | ICD-10-CM | POA: Diagnosis not present

## 2021-06-07 DIAGNOSIS — C44111 Basal cell carcinoma of skin of unspecified eyelid, including canthus: Secondary | ICD-10-CM | POA: Diagnosis not present

## 2021-06-08 DIAGNOSIS — Z7984 Long term (current) use of oral hypoglycemic drugs: Secondary | ICD-10-CM | POA: Diagnosis not present

## 2021-06-08 DIAGNOSIS — F17210 Nicotine dependence, cigarettes, uncomplicated: Secondary | ICD-10-CM | POA: Diagnosis not present

## 2021-06-08 DIAGNOSIS — E119 Type 2 diabetes mellitus without complications: Secondary | ICD-10-CM | POA: Diagnosis not present

## 2021-06-08 DIAGNOSIS — E7800 Pure hypercholesterolemia, unspecified: Secondary | ICD-10-CM | POA: Diagnosis not present

## 2021-06-08 DIAGNOSIS — Z79899 Other long term (current) drug therapy: Secondary | ICD-10-CM | POA: Diagnosis not present

## 2021-06-08 DIAGNOSIS — I10 Essential (primary) hypertension: Secondary | ICD-10-CM | POA: Diagnosis not present

## 2021-06-08 DIAGNOSIS — C44111 Basal cell carcinoma of skin of unspecified eyelid, including canthus: Secondary | ICD-10-CM | POA: Diagnosis not present

## 2021-06-11 DIAGNOSIS — C44111 Basal cell carcinoma of skin of unspecified eyelid, including canthus: Secondary | ICD-10-CM | POA: Diagnosis not present

## 2021-06-11 DIAGNOSIS — F17210 Nicotine dependence, cigarettes, uncomplicated: Secondary | ICD-10-CM | POA: Diagnosis not present

## 2021-06-11 DIAGNOSIS — I10 Essential (primary) hypertension: Secondary | ICD-10-CM | POA: Diagnosis not present

## 2021-06-11 DIAGNOSIS — E7800 Pure hypercholesterolemia, unspecified: Secondary | ICD-10-CM | POA: Diagnosis not present

## 2021-06-11 DIAGNOSIS — Z79899 Other long term (current) drug therapy: Secondary | ICD-10-CM | POA: Diagnosis not present

## 2021-06-11 DIAGNOSIS — Z7984 Long term (current) use of oral hypoglycemic drugs: Secondary | ICD-10-CM | POA: Diagnosis not present

## 2021-06-11 DIAGNOSIS — E119 Type 2 diabetes mellitus without complications: Secondary | ICD-10-CM | POA: Diagnosis not present

## 2021-06-12 DIAGNOSIS — I10 Essential (primary) hypertension: Secondary | ICD-10-CM | POA: Diagnosis not present

## 2021-06-12 DIAGNOSIS — Z7984 Long term (current) use of oral hypoglycemic drugs: Secondary | ICD-10-CM | POA: Diagnosis not present

## 2021-06-12 DIAGNOSIS — C44111 Basal cell carcinoma of skin of unspecified eyelid, including canthus: Secondary | ICD-10-CM | POA: Diagnosis not present

## 2021-06-12 DIAGNOSIS — E7800 Pure hypercholesterolemia, unspecified: Secondary | ICD-10-CM | POA: Diagnosis not present

## 2021-06-12 DIAGNOSIS — Z79899 Other long term (current) drug therapy: Secondary | ICD-10-CM | POA: Diagnosis not present

## 2021-06-12 DIAGNOSIS — E119 Type 2 diabetes mellitus without complications: Secondary | ICD-10-CM | POA: Diagnosis not present

## 2021-06-12 DIAGNOSIS — F17210 Nicotine dependence, cigarettes, uncomplicated: Secondary | ICD-10-CM | POA: Diagnosis not present

## 2021-06-13 DIAGNOSIS — I10 Essential (primary) hypertension: Secondary | ICD-10-CM | POA: Diagnosis not present

## 2021-06-13 DIAGNOSIS — Z7984 Long term (current) use of oral hypoglycemic drugs: Secondary | ICD-10-CM | POA: Diagnosis not present

## 2021-06-13 DIAGNOSIS — Z79899 Other long term (current) drug therapy: Secondary | ICD-10-CM | POA: Diagnosis not present

## 2021-06-13 DIAGNOSIS — F17210 Nicotine dependence, cigarettes, uncomplicated: Secondary | ICD-10-CM | POA: Diagnosis not present

## 2021-06-13 DIAGNOSIS — E119 Type 2 diabetes mellitus without complications: Secondary | ICD-10-CM | POA: Diagnosis not present

## 2021-06-13 DIAGNOSIS — E7800 Pure hypercholesterolemia, unspecified: Secondary | ICD-10-CM | POA: Diagnosis not present

## 2021-06-13 DIAGNOSIS — C44111 Basal cell carcinoma of skin of unspecified eyelid, including canthus: Secondary | ICD-10-CM | POA: Diagnosis not present

## 2021-06-14 DIAGNOSIS — Z7984 Long term (current) use of oral hypoglycemic drugs: Secondary | ICD-10-CM | POA: Diagnosis not present

## 2021-06-14 DIAGNOSIS — Z79899 Other long term (current) drug therapy: Secondary | ICD-10-CM | POA: Diagnosis not present

## 2021-06-14 DIAGNOSIS — C441122 Basal cell carcinoma of skin of right lower eyelid, including canthus: Secondary | ICD-10-CM | POA: Diagnosis not present

## 2021-06-14 DIAGNOSIS — F17210 Nicotine dependence, cigarettes, uncomplicated: Secondary | ICD-10-CM | POA: Diagnosis not present

## 2021-06-14 DIAGNOSIS — C441121 Basal cell carcinoma of skin of right upper eyelid, including canthus: Secondary | ICD-10-CM | POA: Diagnosis not present

## 2021-06-14 DIAGNOSIS — I10 Essential (primary) hypertension: Secondary | ICD-10-CM | POA: Diagnosis not present

## 2021-06-14 DIAGNOSIS — C441191 Basal cell carcinoma of skin of left upper eyelid, including canthus: Secondary | ICD-10-CM | POA: Diagnosis not present

## 2021-06-14 DIAGNOSIS — E119 Type 2 diabetes mellitus without complications: Secondary | ICD-10-CM | POA: Diagnosis not present

## 2021-06-14 DIAGNOSIS — Z09 Encounter for follow-up examination after completed treatment for conditions other than malignant neoplasm: Secondary | ICD-10-CM | POA: Diagnosis not present

## 2021-06-14 DIAGNOSIS — Z189 Retained foreign body fragments, unspecified material: Secondary | ICD-10-CM | POA: Diagnosis not present

## 2021-06-14 DIAGNOSIS — C44111 Basal cell carcinoma of skin of unspecified eyelid, including canthus: Secondary | ICD-10-CM | POA: Diagnosis not present

## 2021-06-14 DIAGNOSIS — L589 Radiodermatitis, unspecified: Secondary | ICD-10-CM | POA: Diagnosis not present

## 2021-06-14 DIAGNOSIS — E7800 Pure hypercholesterolemia, unspecified: Secondary | ICD-10-CM | POA: Diagnosis not present

## 2021-06-14 DIAGNOSIS — S0035XD Superficial foreign body of nose, subsequent encounter: Secondary | ICD-10-CM | POA: Diagnosis not present

## 2021-06-15 DIAGNOSIS — E119 Type 2 diabetes mellitus without complications: Secondary | ICD-10-CM | POA: Diagnosis not present

## 2021-06-15 DIAGNOSIS — Z7984 Long term (current) use of oral hypoglycemic drugs: Secondary | ICD-10-CM | POA: Diagnosis not present

## 2021-06-15 DIAGNOSIS — F17210 Nicotine dependence, cigarettes, uncomplicated: Secondary | ICD-10-CM | POA: Diagnosis not present

## 2021-06-15 DIAGNOSIS — E7800 Pure hypercholesterolemia, unspecified: Secondary | ICD-10-CM | POA: Diagnosis not present

## 2021-06-15 DIAGNOSIS — Z79899 Other long term (current) drug therapy: Secondary | ICD-10-CM | POA: Diagnosis not present

## 2021-06-15 DIAGNOSIS — C44111 Basal cell carcinoma of skin of unspecified eyelid, including canthus: Secondary | ICD-10-CM | POA: Diagnosis not present

## 2021-06-15 DIAGNOSIS — I10 Essential (primary) hypertension: Secondary | ICD-10-CM | POA: Diagnosis not present

## 2021-06-18 DIAGNOSIS — I10 Essential (primary) hypertension: Secondary | ICD-10-CM | POA: Diagnosis not present

## 2021-06-18 DIAGNOSIS — E119 Type 2 diabetes mellitus without complications: Secondary | ICD-10-CM | POA: Diagnosis not present

## 2021-06-18 DIAGNOSIS — E7800 Pure hypercholesterolemia, unspecified: Secondary | ICD-10-CM | POA: Diagnosis not present

## 2021-06-18 DIAGNOSIS — K219 Gastro-esophageal reflux disease without esophagitis: Secondary | ICD-10-CM | POA: Diagnosis not present

## 2021-06-18 DIAGNOSIS — Z7984 Long term (current) use of oral hypoglycemic drugs: Secondary | ICD-10-CM | POA: Diagnosis not present

## 2021-06-18 DIAGNOSIS — F17210 Nicotine dependence, cigarettes, uncomplicated: Secondary | ICD-10-CM | POA: Diagnosis not present

## 2021-06-18 DIAGNOSIS — Z79899 Other long term (current) drug therapy: Secondary | ICD-10-CM | POA: Diagnosis not present

## 2021-06-18 DIAGNOSIS — C44111 Basal cell carcinoma of skin of unspecified eyelid, including canthus: Secondary | ICD-10-CM | POA: Diagnosis not present

## 2021-06-19 DIAGNOSIS — E7800 Pure hypercholesterolemia, unspecified: Secondary | ICD-10-CM | POA: Diagnosis not present

## 2021-06-19 DIAGNOSIS — Z79899 Other long term (current) drug therapy: Secondary | ICD-10-CM | POA: Diagnosis not present

## 2021-06-19 DIAGNOSIS — F17210 Nicotine dependence, cigarettes, uncomplicated: Secondary | ICD-10-CM | POA: Diagnosis not present

## 2021-06-19 DIAGNOSIS — Z7984 Long term (current) use of oral hypoglycemic drugs: Secondary | ICD-10-CM | POA: Diagnosis not present

## 2021-06-19 DIAGNOSIS — E119 Type 2 diabetes mellitus without complications: Secondary | ICD-10-CM | POA: Diagnosis not present

## 2021-06-19 DIAGNOSIS — C44111 Basal cell carcinoma of skin of unspecified eyelid, including canthus: Secondary | ICD-10-CM | POA: Diagnosis not present

## 2021-06-19 DIAGNOSIS — I10 Essential (primary) hypertension: Secondary | ICD-10-CM | POA: Diagnosis not present

## 2021-06-20 DIAGNOSIS — E119 Type 2 diabetes mellitus without complications: Secondary | ICD-10-CM | POA: Diagnosis not present

## 2021-06-20 DIAGNOSIS — I10 Essential (primary) hypertension: Secondary | ICD-10-CM | POA: Diagnosis not present

## 2021-06-20 DIAGNOSIS — C44111 Basal cell carcinoma of skin of unspecified eyelid, including canthus: Secondary | ICD-10-CM | POA: Diagnosis not present

## 2021-06-20 DIAGNOSIS — F17210 Nicotine dependence, cigarettes, uncomplicated: Secondary | ICD-10-CM | POA: Diagnosis not present

## 2021-06-20 DIAGNOSIS — E7800 Pure hypercholesterolemia, unspecified: Secondary | ICD-10-CM | POA: Diagnosis not present

## 2021-06-20 DIAGNOSIS — Z7984 Long term (current) use of oral hypoglycemic drugs: Secondary | ICD-10-CM | POA: Diagnosis not present

## 2021-06-20 DIAGNOSIS — Z79899 Other long term (current) drug therapy: Secondary | ICD-10-CM | POA: Diagnosis not present

## 2021-06-21 DIAGNOSIS — I10 Essential (primary) hypertension: Secondary | ICD-10-CM | POA: Diagnosis not present

## 2021-06-21 DIAGNOSIS — C44111 Basal cell carcinoma of skin of unspecified eyelid, including canthus: Secondary | ICD-10-CM | POA: Diagnosis not present

## 2021-06-21 DIAGNOSIS — E7800 Pure hypercholesterolemia, unspecified: Secondary | ICD-10-CM | POA: Diagnosis not present

## 2021-06-21 DIAGNOSIS — Z7984 Long term (current) use of oral hypoglycemic drugs: Secondary | ICD-10-CM | POA: Diagnosis not present

## 2021-06-21 DIAGNOSIS — F17210 Nicotine dependence, cigarettes, uncomplicated: Secondary | ICD-10-CM | POA: Diagnosis not present

## 2021-06-21 DIAGNOSIS — Z79899 Other long term (current) drug therapy: Secondary | ICD-10-CM | POA: Diagnosis not present

## 2021-06-21 DIAGNOSIS — E119 Type 2 diabetes mellitus without complications: Secondary | ICD-10-CM | POA: Diagnosis not present

## 2021-06-22 DIAGNOSIS — Z79899 Other long term (current) drug therapy: Secondary | ICD-10-CM | POA: Diagnosis not present

## 2021-06-22 DIAGNOSIS — C44111 Basal cell carcinoma of skin of unspecified eyelid, including canthus: Secondary | ICD-10-CM | POA: Diagnosis not present

## 2021-06-22 DIAGNOSIS — E119 Type 2 diabetes mellitus without complications: Secondary | ICD-10-CM | POA: Diagnosis not present

## 2021-06-22 DIAGNOSIS — F17210 Nicotine dependence, cigarettes, uncomplicated: Secondary | ICD-10-CM | POA: Diagnosis not present

## 2021-06-22 DIAGNOSIS — Z7984 Long term (current) use of oral hypoglycemic drugs: Secondary | ICD-10-CM | POA: Diagnosis not present

## 2021-06-22 DIAGNOSIS — E7800 Pure hypercholesterolemia, unspecified: Secondary | ICD-10-CM | POA: Diagnosis not present

## 2021-06-22 DIAGNOSIS — I10 Essential (primary) hypertension: Secondary | ICD-10-CM | POA: Diagnosis not present

## 2021-06-25 DIAGNOSIS — E119 Type 2 diabetes mellitus without complications: Secondary | ICD-10-CM | POA: Diagnosis not present

## 2021-06-25 DIAGNOSIS — Z79899 Other long term (current) drug therapy: Secondary | ICD-10-CM | POA: Diagnosis not present

## 2021-06-25 DIAGNOSIS — C44111 Basal cell carcinoma of skin of unspecified eyelid, including canthus: Secondary | ICD-10-CM | POA: Diagnosis not present

## 2021-06-25 DIAGNOSIS — E7800 Pure hypercholesterolemia, unspecified: Secondary | ICD-10-CM | POA: Diagnosis not present

## 2021-06-25 DIAGNOSIS — F17210 Nicotine dependence, cigarettes, uncomplicated: Secondary | ICD-10-CM | POA: Diagnosis not present

## 2021-06-25 DIAGNOSIS — Z7984 Long term (current) use of oral hypoglycemic drugs: Secondary | ICD-10-CM | POA: Diagnosis not present

## 2021-06-25 DIAGNOSIS — I10 Essential (primary) hypertension: Secondary | ICD-10-CM | POA: Diagnosis not present

## 2021-06-26 DIAGNOSIS — E119 Type 2 diabetes mellitus without complications: Secondary | ICD-10-CM | POA: Diagnosis not present

## 2021-06-26 DIAGNOSIS — I10 Essential (primary) hypertension: Secondary | ICD-10-CM | POA: Diagnosis not present

## 2021-06-26 DIAGNOSIS — E7800 Pure hypercholesterolemia, unspecified: Secondary | ICD-10-CM | POA: Diagnosis not present

## 2021-06-26 DIAGNOSIS — F17210 Nicotine dependence, cigarettes, uncomplicated: Secondary | ICD-10-CM | POA: Diagnosis not present

## 2021-06-26 DIAGNOSIS — C44111 Basal cell carcinoma of skin of unspecified eyelid, including canthus: Secondary | ICD-10-CM | POA: Diagnosis not present

## 2021-06-26 DIAGNOSIS — Z7984 Long term (current) use of oral hypoglycemic drugs: Secondary | ICD-10-CM | POA: Diagnosis not present

## 2021-06-26 DIAGNOSIS — Z79899 Other long term (current) drug therapy: Secondary | ICD-10-CM | POA: Diagnosis not present

## 2021-06-27 DIAGNOSIS — Z79899 Other long term (current) drug therapy: Secondary | ICD-10-CM | POA: Diagnosis not present

## 2021-06-27 DIAGNOSIS — E7800 Pure hypercholesterolemia, unspecified: Secondary | ICD-10-CM | POA: Diagnosis not present

## 2021-06-27 DIAGNOSIS — C44111 Basal cell carcinoma of skin of unspecified eyelid, including canthus: Secondary | ICD-10-CM | POA: Diagnosis not present

## 2021-06-27 DIAGNOSIS — Z7984 Long term (current) use of oral hypoglycemic drugs: Secondary | ICD-10-CM | POA: Diagnosis not present

## 2021-06-27 DIAGNOSIS — E119 Type 2 diabetes mellitus without complications: Secondary | ICD-10-CM | POA: Diagnosis not present

## 2021-06-27 DIAGNOSIS — I10 Essential (primary) hypertension: Secondary | ICD-10-CM | POA: Diagnosis not present

## 2021-06-27 DIAGNOSIS — F17210 Nicotine dependence, cigarettes, uncomplicated: Secondary | ICD-10-CM | POA: Diagnosis not present

## 2021-06-28 DIAGNOSIS — Z7984 Long term (current) use of oral hypoglycemic drugs: Secondary | ICD-10-CM | POA: Diagnosis not present

## 2021-06-28 DIAGNOSIS — I10 Essential (primary) hypertension: Secondary | ICD-10-CM | POA: Diagnosis not present

## 2021-06-28 DIAGNOSIS — E119 Type 2 diabetes mellitus without complications: Secondary | ICD-10-CM | POA: Diagnosis not present

## 2021-06-28 DIAGNOSIS — Z79899 Other long term (current) drug therapy: Secondary | ICD-10-CM | POA: Diagnosis not present

## 2021-06-28 DIAGNOSIS — C44111 Basal cell carcinoma of skin of unspecified eyelid, including canthus: Secondary | ICD-10-CM | POA: Diagnosis not present

## 2021-06-28 DIAGNOSIS — F17210 Nicotine dependence, cigarettes, uncomplicated: Secondary | ICD-10-CM | POA: Diagnosis not present

## 2021-06-28 DIAGNOSIS — E7800 Pure hypercholesterolemia, unspecified: Secondary | ICD-10-CM | POA: Diagnosis not present

## 2021-06-29 DIAGNOSIS — I10 Essential (primary) hypertension: Secondary | ICD-10-CM | POA: Diagnosis not present

## 2021-06-29 DIAGNOSIS — C44111 Basal cell carcinoma of skin of unspecified eyelid, including canthus: Secondary | ICD-10-CM | POA: Diagnosis not present

## 2021-06-29 DIAGNOSIS — F17210 Nicotine dependence, cigarettes, uncomplicated: Secondary | ICD-10-CM | POA: Diagnosis not present

## 2021-06-29 DIAGNOSIS — E7800 Pure hypercholesterolemia, unspecified: Secondary | ICD-10-CM | POA: Diagnosis not present

## 2021-06-29 DIAGNOSIS — Z79899 Other long term (current) drug therapy: Secondary | ICD-10-CM | POA: Diagnosis not present

## 2021-06-29 DIAGNOSIS — Z7984 Long term (current) use of oral hypoglycemic drugs: Secondary | ICD-10-CM | POA: Diagnosis not present

## 2021-06-29 DIAGNOSIS — E119 Type 2 diabetes mellitus without complications: Secondary | ICD-10-CM | POA: Diagnosis not present

## 2021-07-02 DIAGNOSIS — C44111 Basal cell carcinoma of skin of unspecified eyelid, including canthus: Secondary | ICD-10-CM | POA: Diagnosis not present

## 2021-07-02 DIAGNOSIS — Z79899 Other long term (current) drug therapy: Secondary | ICD-10-CM | POA: Diagnosis not present

## 2021-07-02 DIAGNOSIS — Z7984 Long term (current) use of oral hypoglycemic drugs: Secondary | ICD-10-CM | POA: Diagnosis not present

## 2021-07-02 DIAGNOSIS — F17210 Nicotine dependence, cigarettes, uncomplicated: Secondary | ICD-10-CM | POA: Diagnosis not present

## 2021-07-02 DIAGNOSIS — E7800 Pure hypercholesterolemia, unspecified: Secondary | ICD-10-CM | POA: Diagnosis not present

## 2021-07-02 DIAGNOSIS — I10 Essential (primary) hypertension: Secondary | ICD-10-CM | POA: Diagnosis not present

## 2021-07-02 DIAGNOSIS — E119 Type 2 diabetes mellitus without complications: Secondary | ICD-10-CM | POA: Diagnosis not present

## 2021-07-03 DIAGNOSIS — F17210 Nicotine dependence, cigarettes, uncomplicated: Secondary | ICD-10-CM | POA: Diagnosis not present

## 2021-07-03 DIAGNOSIS — Z79899 Other long term (current) drug therapy: Secondary | ICD-10-CM | POA: Diagnosis not present

## 2021-07-03 DIAGNOSIS — I10 Essential (primary) hypertension: Secondary | ICD-10-CM | POA: Diagnosis not present

## 2021-07-03 DIAGNOSIS — E119 Type 2 diabetes mellitus without complications: Secondary | ICD-10-CM | POA: Diagnosis not present

## 2021-07-03 DIAGNOSIS — Z7984 Long term (current) use of oral hypoglycemic drugs: Secondary | ICD-10-CM | POA: Diagnosis not present

## 2021-07-03 DIAGNOSIS — C44111 Basal cell carcinoma of skin of unspecified eyelid, including canthus: Secondary | ICD-10-CM | POA: Diagnosis not present

## 2021-07-03 DIAGNOSIS — E7800 Pure hypercholesterolemia, unspecified: Secondary | ICD-10-CM | POA: Diagnosis not present

## 2021-07-04 DIAGNOSIS — C44111 Basal cell carcinoma of skin of unspecified eyelid, including canthus: Secondary | ICD-10-CM | POA: Diagnosis not present

## 2021-07-04 DIAGNOSIS — E7800 Pure hypercholesterolemia, unspecified: Secondary | ICD-10-CM | POA: Diagnosis not present

## 2021-07-04 DIAGNOSIS — I10 Essential (primary) hypertension: Secondary | ICD-10-CM | POA: Diagnosis not present

## 2021-07-04 DIAGNOSIS — E119 Type 2 diabetes mellitus without complications: Secondary | ICD-10-CM | POA: Diagnosis not present

## 2021-07-04 DIAGNOSIS — Z7984 Long term (current) use of oral hypoglycemic drugs: Secondary | ICD-10-CM | POA: Diagnosis not present

## 2021-07-04 DIAGNOSIS — F17210 Nicotine dependence, cigarettes, uncomplicated: Secondary | ICD-10-CM | POA: Diagnosis not present

## 2021-07-04 DIAGNOSIS — Z79899 Other long term (current) drug therapy: Secondary | ICD-10-CM | POA: Diagnosis not present

## 2021-07-05 DIAGNOSIS — C44111 Basal cell carcinoma of skin of unspecified eyelid, including canthus: Secondary | ICD-10-CM | POA: Diagnosis not present

## 2021-07-05 DIAGNOSIS — Z79899 Other long term (current) drug therapy: Secondary | ICD-10-CM | POA: Diagnosis not present

## 2021-07-05 DIAGNOSIS — Z7984 Long term (current) use of oral hypoglycemic drugs: Secondary | ICD-10-CM | POA: Diagnosis not present

## 2021-07-05 DIAGNOSIS — F17210 Nicotine dependence, cigarettes, uncomplicated: Secondary | ICD-10-CM | POA: Diagnosis not present

## 2021-07-05 DIAGNOSIS — E119 Type 2 diabetes mellitus without complications: Secondary | ICD-10-CM | POA: Diagnosis not present

## 2021-07-05 DIAGNOSIS — E7800 Pure hypercholesterolemia, unspecified: Secondary | ICD-10-CM | POA: Diagnosis not present

## 2021-07-05 DIAGNOSIS — I10 Essential (primary) hypertension: Secondary | ICD-10-CM | POA: Diagnosis not present

## 2021-07-06 DIAGNOSIS — Z79899 Other long term (current) drug therapy: Secondary | ICD-10-CM | POA: Diagnosis not present

## 2021-07-06 DIAGNOSIS — Z7984 Long term (current) use of oral hypoglycemic drugs: Secondary | ICD-10-CM | POA: Diagnosis not present

## 2021-07-06 DIAGNOSIS — E7800 Pure hypercholesterolemia, unspecified: Secondary | ICD-10-CM | POA: Diagnosis not present

## 2021-07-06 DIAGNOSIS — C44111 Basal cell carcinoma of skin of unspecified eyelid, including canthus: Secondary | ICD-10-CM | POA: Diagnosis not present

## 2021-07-06 DIAGNOSIS — I10 Essential (primary) hypertension: Secondary | ICD-10-CM | POA: Diagnosis not present

## 2021-07-06 DIAGNOSIS — F17210 Nicotine dependence, cigarettes, uncomplicated: Secondary | ICD-10-CM | POA: Diagnosis not present

## 2021-07-06 DIAGNOSIS — E119 Type 2 diabetes mellitus without complications: Secondary | ICD-10-CM | POA: Diagnosis not present

## 2021-07-09 DIAGNOSIS — I10 Essential (primary) hypertension: Secondary | ICD-10-CM | POA: Diagnosis not present

## 2021-07-09 DIAGNOSIS — F17210 Nicotine dependence, cigarettes, uncomplicated: Secondary | ICD-10-CM | POA: Diagnosis not present

## 2021-07-09 DIAGNOSIS — C44111 Basal cell carcinoma of skin of unspecified eyelid, including canthus: Secondary | ICD-10-CM | POA: Diagnosis not present

## 2021-07-09 DIAGNOSIS — Z79899 Other long term (current) drug therapy: Secondary | ICD-10-CM | POA: Diagnosis not present

## 2021-07-09 DIAGNOSIS — E7800 Pure hypercholesterolemia, unspecified: Secondary | ICD-10-CM | POA: Diagnosis not present

## 2021-07-09 DIAGNOSIS — E119 Type 2 diabetes mellitus without complications: Secondary | ICD-10-CM | POA: Diagnosis not present

## 2021-07-09 DIAGNOSIS — Z7984 Long term (current) use of oral hypoglycemic drugs: Secondary | ICD-10-CM | POA: Diagnosis not present

## 2021-07-16 DIAGNOSIS — C44310 Basal cell carcinoma of skin of unspecified parts of face: Secondary | ICD-10-CM | POA: Diagnosis not present

## 2021-07-16 DIAGNOSIS — I10 Essential (primary) hypertension: Secondary | ICD-10-CM | POA: Diagnosis not present

## 2021-07-16 DIAGNOSIS — F17210 Nicotine dependence, cigarettes, uncomplicated: Secondary | ICD-10-CM | POA: Diagnosis not present

## 2021-07-16 DIAGNOSIS — E1165 Type 2 diabetes mellitus with hyperglycemia: Secondary | ICD-10-CM | POA: Diagnosis not present

## 2021-07-16 DIAGNOSIS — Z299 Encounter for prophylactic measures, unspecified: Secondary | ICD-10-CM | POA: Diagnosis not present

## 2021-07-18 DIAGNOSIS — I10 Essential (primary) hypertension: Secondary | ICD-10-CM | POA: Diagnosis not present

## 2021-07-18 DIAGNOSIS — E7800 Pure hypercholesterolemia, unspecified: Secondary | ICD-10-CM | POA: Diagnosis not present

## 2021-07-24 DIAGNOSIS — C44111 Basal cell carcinoma of skin of unspecified eyelid, including canthus: Secondary | ICD-10-CM | POA: Diagnosis not present

## 2021-07-24 DIAGNOSIS — Z79899 Other long term (current) drug therapy: Secondary | ICD-10-CM | POA: Diagnosis not present

## 2021-07-24 DIAGNOSIS — Z7984 Long term (current) use of oral hypoglycemic drugs: Secondary | ICD-10-CM | POA: Diagnosis not present

## 2021-07-24 DIAGNOSIS — E119 Type 2 diabetes mellitus without complications: Secondary | ICD-10-CM | POA: Diagnosis not present

## 2021-07-24 DIAGNOSIS — I10 Essential (primary) hypertension: Secondary | ICD-10-CM | POA: Diagnosis not present

## 2021-07-24 DIAGNOSIS — E7800 Pure hypercholesterolemia, unspecified: Secondary | ICD-10-CM | POA: Diagnosis not present

## 2021-07-24 DIAGNOSIS — F17210 Nicotine dependence, cigarettes, uncomplicated: Secondary | ICD-10-CM | POA: Diagnosis not present

## 2021-07-26 DIAGNOSIS — H10401 Unspecified chronic conjunctivitis, right eye: Secondary | ICD-10-CM | POA: Diagnosis not present

## 2021-07-26 DIAGNOSIS — Z189 Retained foreign body fragments, unspecified material: Secondary | ICD-10-CM | POA: Diagnosis not present

## 2021-07-26 DIAGNOSIS — L589 Radiodermatitis, unspecified: Secondary | ICD-10-CM | POA: Diagnosis not present

## 2021-07-26 DIAGNOSIS — C441191 Basal cell carcinoma of skin of left upper eyelid, including canthus: Secondary | ICD-10-CM | POA: Diagnosis not present

## 2021-07-26 DIAGNOSIS — C441122 Basal cell carcinoma of skin of right lower eyelid, including canthus: Secondary | ICD-10-CM | POA: Diagnosis not present

## 2021-07-26 DIAGNOSIS — S0035XD Superficial foreign body of nose, subsequent encounter: Secondary | ICD-10-CM | POA: Diagnosis not present

## 2021-07-26 DIAGNOSIS — C441121 Basal cell carcinoma of skin of right upper eyelid, including canthus: Secondary | ICD-10-CM | POA: Diagnosis not present

## 2021-07-26 DIAGNOSIS — H00032 Abscess of right lower eyelid: Secondary | ICD-10-CM | POA: Diagnosis not present

## 2021-08-01 DIAGNOSIS — L589 Radiodermatitis, unspecified: Secondary | ICD-10-CM | POA: Diagnosis not present

## 2021-08-01 DIAGNOSIS — C441191 Basal cell carcinoma of skin of left upper eyelid, including canthus: Secondary | ICD-10-CM | POA: Diagnosis not present

## 2021-08-01 DIAGNOSIS — C441121 Basal cell carcinoma of skin of right upper eyelid, including canthus: Secondary | ICD-10-CM | POA: Diagnosis not present

## 2021-08-01 DIAGNOSIS — S0035XD Superficial foreign body of nose, subsequent encounter: Secondary | ICD-10-CM | POA: Diagnosis not present

## 2021-08-01 DIAGNOSIS — Z189 Retained foreign body fragments, unspecified material: Secondary | ICD-10-CM | POA: Diagnosis not present

## 2021-08-01 DIAGNOSIS — C441122 Basal cell carcinoma of skin of right lower eyelid, including canthus: Secondary | ICD-10-CM | POA: Diagnosis not present

## 2021-08-01 DIAGNOSIS — H10401 Unspecified chronic conjunctivitis, right eye: Secondary | ICD-10-CM | POA: Diagnosis not present

## 2021-08-01 DIAGNOSIS — H00032 Abscess of right lower eyelid: Secondary | ICD-10-CM | POA: Diagnosis not present

## 2021-08-09 DIAGNOSIS — C44111 Basal cell carcinoma of skin of unspecified eyelid, including canthus: Secondary | ICD-10-CM | POA: Diagnosis not present

## 2021-08-09 DIAGNOSIS — E7800 Pure hypercholesterolemia, unspecified: Secondary | ICD-10-CM | POA: Diagnosis not present

## 2021-08-09 DIAGNOSIS — Z79899 Other long term (current) drug therapy: Secondary | ICD-10-CM | POA: Diagnosis not present

## 2021-08-09 DIAGNOSIS — Z7984 Long term (current) use of oral hypoglycemic drugs: Secondary | ICD-10-CM | POA: Diagnosis not present

## 2021-08-09 DIAGNOSIS — I10 Essential (primary) hypertension: Secondary | ICD-10-CM | POA: Diagnosis not present

## 2021-08-09 DIAGNOSIS — E119 Type 2 diabetes mellitus without complications: Secondary | ICD-10-CM | POA: Diagnosis not present

## 2021-08-09 DIAGNOSIS — F17210 Nicotine dependence, cigarettes, uncomplicated: Secondary | ICD-10-CM | POA: Diagnosis not present

## 2021-08-22 DIAGNOSIS — L589 Radiodermatitis, unspecified: Secondary | ICD-10-CM | POA: Diagnosis not present

## 2021-08-22 DIAGNOSIS — Z189 Retained foreign body fragments, unspecified material: Secondary | ICD-10-CM | POA: Diagnosis not present

## 2021-08-22 DIAGNOSIS — S0035XD Superficial foreign body of nose, subsequent encounter: Secondary | ICD-10-CM | POA: Diagnosis not present

## 2021-08-22 DIAGNOSIS — H00032 Abscess of right lower eyelid: Secondary | ICD-10-CM | POA: Diagnosis not present

## 2021-08-22 DIAGNOSIS — C441121 Basal cell carcinoma of skin of right upper eyelid, including canthus: Secondary | ICD-10-CM | POA: Diagnosis not present

## 2021-08-22 DIAGNOSIS — C441191 Basal cell carcinoma of skin of left upper eyelid, including canthus: Secondary | ICD-10-CM | POA: Diagnosis not present

## 2021-08-22 DIAGNOSIS — C441122 Basal cell carcinoma of skin of right lower eyelid, including canthus: Secondary | ICD-10-CM | POA: Diagnosis not present

## 2021-09-18 DIAGNOSIS — E7800 Pure hypercholesterolemia, unspecified: Secondary | ICD-10-CM | POA: Diagnosis not present

## 2021-09-18 DIAGNOSIS — I10 Essential (primary) hypertension: Secondary | ICD-10-CM | POA: Diagnosis not present

## 2021-10-04 DIAGNOSIS — C441122 Basal cell carcinoma of skin of right lower eyelid, including canthus: Secondary | ICD-10-CM | POA: Diagnosis not present

## 2021-10-04 DIAGNOSIS — C441121 Basal cell carcinoma of skin of right upper eyelid, including canthus: Secondary | ICD-10-CM | POA: Diagnosis not present

## 2021-10-11 DIAGNOSIS — H04221 Epiphora due to insufficient drainage, right lacrimal gland: Secondary | ICD-10-CM | POA: Diagnosis not present

## 2021-10-11 DIAGNOSIS — Z189 Retained foreign body fragments, unspecified material: Secondary | ICD-10-CM | POA: Diagnosis not present

## 2021-10-11 DIAGNOSIS — S0035XD Superficial foreign body of nose, subsequent encounter: Secondary | ICD-10-CM | POA: Diagnosis not present

## 2021-10-11 DIAGNOSIS — C441191 Basal cell carcinoma of skin of left upper eyelid, including canthus: Secondary | ICD-10-CM | POA: Diagnosis not present

## 2021-10-11 DIAGNOSIS — C441121 Basal cell carcinoma of skin of right upper eyelid, including canthus: Secondary | ICD-10-CM | POA: Diagnosis not present

## 2021-10-11 DIAGNOSIS — C441122 Basal cell carcinoma of skin of right lower eyelid, including canthus: Secondary | ICD-10-CM | POA: Diagnosis not present

## 2021-10-12 ENCOUNTER — Ambulatory Visit: Payer: Medicare Other | Admitting: Urology

## 2021-10-15 ENCOUNTER — Ambulatory Visit: Payer: Medicare Other | Admitting: Urology

## 2021-10-15 ENCOUNTER — Other Ambulatory Visit: Payer: Self-pay

## 2021-10-15 ENCOUNTER — Encounter: Payer: Self-pay | Admitting: Urology

## 2021-10-15 VITALS — BP 145/77 | HR 94 | Ht 72.0 in | Wt 180.0 lb

## 2021-10-15 DIAGNOSIS — C61 Malignant neoplasm of prostate: Secondary | ICD-10-CM

## 2021-10-15 DIAGNOSIS — N138 Other obstructive and reflux uropathy: Secondary | ICD-10-CM | POA: Diagnosis not present

## 2021-10-15 DIAGNOSIS — N401 Enlarged prostate with lower urinary tract symptoms: Secondary | ICD-10-CM | POA: Diagnosis not present

## 2021-10-15 DIAGNOSIS — R339 Retention of urine, unspecified: Secondary | ICD-10-CM

## 2021-10-15 DIAGNOSIS — R351 Nocturia: Secondary | ICD-10-CM | POA: Diagnosis not present

## 2021-10-15 LAB — URINALYSIS, ROUTINE W REFLEX MICROSCOPIC
Bilirubin, UA: NEGATIVE
Glucose, UA: NEGATIVE
Ketones, UA: NEGATIVE
Leukocytes,UA: NEGATIVE
Nitrite, UA: NEGATIVE
Protein,UA: NEGATIVE
RBC, UA: NEGATIVE
Specific Gravity, UA: 1.025 (ref 1.005–1.030)
Urobilinogen, Ur: 0.2 mg/dL (ref 0.2–1.0)
pH, UA: 6 (ref 5.0–7.5)

## 2021-10-15 MED ORDER — LEUPROLIDE ACETATE (6 MONTH) 45 MG ~~LOC~~ KIT
45.0000 mg | PACK | Freq: Once | SUBCUTANEOUS | Status: AC
  Administered 2021-10-15: 45 mg via SUBCUTANEOUS

## 2021-10-15 MED ORDER — ALFUZOSIN HCL ER 10 MG PO TB24: 10.0000 mg | ORAL_TABLET | Freq: Every day | ORAL | 3 refills | Status: DC

## 2021-10-15 NOTE — Patient Instructions (Signed)
Prostate Cancer °The prostate is a small gland that helps make semen. It is located below a man's bladder, in front of the rectum. Prostate cancer is when abnormal cells grow in this gland. °What are the causes? °The cause of this condition is not known. °What increases the risk? °Being age 77 or older. °Having a family history of prostate cancer. °Having a family history of cancer of the breasts or ovaries. °Having genes that are passed from parent to child (inherited). °Having Lynch syndrome. °African American men and men of African descent are diagnosed with prostate cancer at higher rates than other men. °What are the signs or symptoms? °Problems peeing (urinating). This may include: °A stream that is weak, or pee that stops and starts. °Trouble starting or stopping your pee. °Trouble emptying all of your pee. °Needing to pee more often, especially at night. °Blood in your pee or semen. °Pain in the: °Lower back. °Lower belly (abdomen). °Hips. °Trouble getting an erection. °Weakness or numbness in the legs or feet. °How is this treated? °Treatment for this condition depends on: °How much the cancer has spread. °Your age. °The kind of treatment you want. °Your health. °Treatments include: °Being watched. This is called observation. You will be tested from time to time, but you will not get treated. Tests are to make sure that the cancer is not growing. °Surgery. This may be done to: °Take out (remove) the prostate. °Freeze and kill cancer cells. °Radiation. This uses a strong beam of energy to kill cancer cells. °Chemotherapy. This uses medicines that stop cancer cells from increasing. This kills cancer cells and healthy cells. °Targeted therapy. This kills cancer cells only. Healthy cells are not affected. °Hormone treatment. This stops the body from making hormones that help the cancer cells grow. °Follow these instructions at home: °Lifestyle °Do not smoke or use any products that contain nicotine or tobacco.  If you need help quitting, ask your doctor. °Eat a healthy diet. °Treatment may affect your ability to have sex. If you have a partner, touch, hold, hug, and caress your partner to have intimate moments. °Get plenty of sleep. °Ask your doctor for help to find a support group for men with prostate cancer. °General instructions °Take over-the-counter and prescription medicines only as told by your doctor. °If you have to go to the hospital, let your cancer doctor (oncologist) know. °Keep all follow-up visits. °Where to find more information °American Cancer Society: www.cancer.org °American Society of Clinical Oncology: www.cancer.net °National Cancer Institute: www.cancer.gov °Contact a doctor if: °You have new or more trouble peeing. °You have new or more blood in your pee. °You have new or more pain in your hips, back, or chest. °Get help right away if: °You have weakness in your legs. °You lose feeling in your legs. °You cannot control your pee or your poop (stool). °You have chills or a fever. °Summary °The prostate is a male gland that helps make semen. °Prostate cancer is when abnormal cells grow in this gland. °Treatment includes doing surgery, using medicines, using strong beams of energy, or watching without treatment. °Ask your doctor for help to find a support group for men with prostate cancer. °Contact a doctor if you have problems peeing or have any new pain that you did not have before. °This information is not intended to replace advice given to you by your health care provider. Make sure you discuss any questions you have with your health care provider. °Document Revised: 11/01/2020 Document Reviewed: 11/01/2020 °Elsevier   Patient Education © 2022 Elsevier Inc. ° °

## 2021-10-15 NOTE — Progress Notes (Signed)
10/15/2021 8:55 AM   Caleb Pugh 11/09/1945 [id number]  Referring provider: Monico Blitz, MD [address] [place],  [place] [address]  Followup prostate cancer   HPI: [name] is a 76yo here for followup for prostate cancer and BPH. IPSS 7 QOL 1 on uroxatral 10mg  qhs. Urine stream strong. Nocturia 1-2x but he drinks 20oz of water within 2 hours of going to bed. No dysuria or hematuria. No recent PSA. No other complaints.  He is due for ADT today. Mild hot flashes on ADT. Mild fatigue   PMH: Past Medical History:  Diagnosis Date   BPH (benign prostatic hyperplasia)    Cancer ([place])    Diabetes mellitus without complication ([place])    High cholesterol    Hypertension     Surgical History: Past Surgical History:  Procedure Laterality Date   CATARACT EXTRACTION W/PHACO Left 12/11/2020   Procedure: CATARACT EXTRACTION PHACO AND INTRAOCULAR LENS PLACEMENT LEFT EYE;  Surgeon: Baruch Goldmann, MD;  Location: [facility];  Service: Ophthalmology;  Laterality: Left;  left CDE=13.96   EVALUATION UNDER ANESTHESIA WITH TEAR DUCT PROBING     GOLD SEED IMPLANT N/A 04/26/2021   Procedure: GOLD SEED IMPLANT;  Surgeon: Cleon Gustin, MD;  Location: [facility];  Service: Urology;  Laterality: N/A;   SPACE OAR INSTILLATION N/A 04/26/2021   Procedure: SPACE OAR INSTILLATION;  Surgeon: Cleon Gustin, MD;  Location: [facility];  Service: Urology;  Laterality: N/A;   TONSILLECTOMY      Home Medications:  Allergies as of 10/15/2021   No Known Allergies      Medication List        Accurate as of October 15, 2021  8:55 AM. If you have any questions, ask your nurse or doctor.          Accu-Chek Guide test strip Generic drug: glucose blood   alfuzosin 10 MG 24 hr tablet Commonly known as: UROXATRAL Take 1 tablet (10 mg total) by mouth daily with supper.   famotidine 20 MG tablet Commonly known as: PEPCID Take 20 mg by mouth at bedtime as needed for heartburn or indigestion.    lisinopril-hydrochlorothiazide 20-12.5 MG tablet Commonly known as: ZESTORETIC Take 2 tablets by mouth daily.   metFORMIN 500 MG tablet Commonly known as: GLUCOPHAGE Take 500 mg by mouth daily.   naproxen sodium 220 MG tablet Commonly known as: ALEVE Take 220 mg by mouth daily as needed (pain).   simvastatin 40 MG tablet Commonly known as: ZOCOR Take 40 mg by mouth at bedtime.        Allergies: No Known Allergies  Family History: No family history on file.  Social History:  reports that he has been smoking cigarettes. He has a 30.00 pack-year smoking history. He has never used smokeless tobacco. He reports that he does not currently use alcohol. He reports that he does not use drugs.  ROS: All other review of systems were reviewed and are negative except what is noted above in HPI  Physical Exam: BP (!) 145/77    Pulse 94    Ht 6' (1.829 m)    Wt 180 lb (81.6 kg)    BMI 24.41 kg/m   Constitutional:  Alert and oriented, No acute distress. HEENT: [place] AT, moist mucus membranes.  Trachea midline, no masses. Cardiovascular: No clubbing, cyanosis, or edema. Respiratory: Normal respiratory effort, no increased work of breathing. GI: Abdomen is soft, nontender, nondistended, no abdominal masses GU: No CVA tenderness.  Lymph: No cervical  or inguinal lymphadenopathy. Skin: No rashes, bruises or suspicious lesions. Neurologic: Grossly intact, no focal deficits, moving all 4 extremities. Psychiatric: Normal mood and affect.  Laboratory Data: Lab Results  Component Value Date   WBC 8.9 05/10/2021   HGB 14.2 05/10/2021   HCT 40.9 05/10/2021   MCV 87.8 05/10/2021   PLT 201 05/10/2021    Lab Results  Component Value Date   CREATININE 1.07 05/10/2021    No results found for: PSA  No results found for: TESTOSTERONE  Lab Results  Component Value Date   HGBA1C 7.2 (H) 04/24/2021    Urinalysis    Component Value Date/Time   APPEARANCEUR Clear 05/11/2021 1110    GLUCOSEU Trace (A) 05/11/2021 1110   BILIRUBINUR Negative 05/11/2021 1110   PROTEINUR Negative 05/11/2021 1110   NITRITE Negative 05/11/2021 1110   LEUKOCYTESUR Negative 05/11/2021 1110    Lab Results  Component Value Date   LABMICR See below: 05/11/2021   WBCUA None seen 05/11/2021   LABEPIT 0-10 05/11/2021   MUCUS Present 05/11/2021   BACTERIA None seen 05/11/2021    Pertinent Imaging:  No results found for this or any previous visit.  No results found for this or any previous visit.  No results found for this or any previous visit.  No results found for this or any previous visit.  No results found for this or any previous visit.  No results found for this or any previous visit.  No results found for this or any previous visit.  No results found for this or any previous visit.   Assessment & Plan:    1. Prostate cancer ([place]) -RTC 6 months with PSA - leuprolide (6 Month) (ELIGARD) injection 45 mg - Urinalysis, Routine w reflex microscopic  2. Benign prostatic hyperplasia with urinary obstruction -continue uroxatral 10mg  qhs  3. Nocturia -continue uroxatral 10mg  qhs   No follow-ups on file.  Nicolette Bang, MD  [facility] [place]

## 2021-10-16 LAB — PSA: Prostate Specific Ag, Serum: 0.2 ng/mL (ref 0.0–4.0)

## 2021-10-17 DIAGNOSIS — C44101 Unspecified malignant neoplasm of skin of unspecified eyelid, including canthus: Secondary | ICD-10-CM | POA: Diagnosis not present

## 2021-10-17 DIAGNOSIS — Z79899 Other long term (current) drug therapy: Secondary | ICD-10-CM | POA: Diagnosis not present

## 2021-10-17 DIAGNOSIS — C44111 Basal cell carcinoma of skin of unspecified eyelid, including canthus: Secondary | ICD-10-CM | POA: Diagnosis not present

## 2021-10-17 DIAGNOSIS — Z7984 Long term (current) use of oral hypoglycemic drugs: Secondary | ICD-10-CM | POA: Diagnosis not present

## 2021-10-17 DIAGNOSIS — I10 Essential (primary) hypertension: Secondary | ICD-10-CM | POA: Diagnosis not present

## 2021-10-17 DIAGNOSIS — E119 Type 2 diabetes mellitus without complications: Secondary | ICD-10-CM | POA: Diagnosis not present

## 2021-10-17 DIAGNOSIS — F17210 Nicotine dependence, cigarettes, uncomplicated: Secondary | ICD-10-CM | POA: Diagnosis not present

## 2021-10-17 DIAGNOSIS — E7800 Pure hypercholesterolemia, unspecified: Secondary | ICD-10-CM | POA: Diagnosis not present

## 2021-10-19 DIAGNOSIS — E1165 Type 2 diabetes mellitus with hyperglycemia: Secondary | ICD-10-CM | POA: Diagnosis not present

## 2021-10-19 DIAGNOSIS — E1142 Type 2 diabetes mellitus with diabetic polyneuropathy: Secondary | ICD-10-CM | POA: Diagnosis not present

## 2021-10-19 DIAGNOSIS — C44310 Basal cell carcinoma of skin of unspecified parts of face: Secondary | ICD-10-CM | POA: Diagnosis not present

## 2021-10-19 DIAGNOSIS — Z299 Encounter for prophylactic measures, unspecified: Secondary | ICD-10-CM | POA: Diagnosis not present

## 2021-10-19 DIAGNOSIS — F17210 Nicotine dependence, cigarettes, uncomplicated: Secondary | ICD-10-CM | POA: Diagnosis not present

## 2021-10-19 DIAGNOSIS — I10 Essential (primary) hypertension: Secondary | ICD-10-CM | POA: Diagnosis not present

## 2021-11-15 DIAGNOSIS — I10 Essential (primary) hypertension: Secondary | ICD-10-CM | POA: Diagnosis not present

## 2021-11-15 DIAGNOSIS — E7800 Pure hypercholesterolemia, unspecified: Secondary | ICD-10-CM | POA: Diagnosis not present

## 2021-11-27 ENCOUNTER — Inpatient Hospital Stay: Payer: Medicare Other | Attending: Oncology | Admitting: Oncology

## 2021-11-27 ENCOUNTER — Other Ambulatory Visit: Payer: Self-pay

## 2021-11-27 VITALS — BP 135/74 | HR 93 | Temp 98.0°F | Resp 17 | Ht 72.0 in | Wt 187.1 lb

## 2021-11-27 DIAGNOSIS — R232 Flushing: Secondary | ICD-10-CM | POA: Diagnosis not present

## 2021-11-27 DIAGNOSIS — Z79899 Other long term (current) drug therapy: Secondary | ICD-10-CM | POA: Insufficient documentation

## 2021-11-27 DIAGNOSIS — Z923 Personal history of irradiation: Secondary | ICD-10-CM | POA: Insufficient documentation

## 2021-11-27 DIAGNOSIS — C44111 Basal cell carcinoma of skin of unspecified eyelid, including canthus: Secondary | ICD-10-CM

## 2021-11-27 DIAGNOSIS — C61 Malignant neoplasm of prostate: Secondary | ICD-10-CM | POA: Diagnosis present

## 2021-11-27 NOTE — Progress Notes (Signed)
Hematology and Oncology Follow Up Visit ? ?Caleb Pugh ?[id number] ?[birth year] 76 y.o. ?11/27/2021 9:52 AM ?Monico Blitz, MDShah, Ashish, MD  ? ?Principle Diagnosis: 76 year old man: ? ? ?Prostate cancer presented with localized disease, Gleason score is 4+4 = 8 in 2 cores, PSA is 7.4 in May 2022. ? ?2.    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 ? ? ?Prior therapy:  ? ?Erivedge 150 mg daily started in May 2022.  Therapy discontinued due to lack of response. ? ?Radiation therapy to the medial canthus as well as prostate completed under the care of Dr. Lynnette Caffey in [facility]. ? ? ?Current therapy: Eligard 45 mg every 6 months started in 2022 under the care of Dr. Alyson Ingles.  Plan is to continue with this for 2 years. ? ?Interim History: Caleb Pugh returns today for repeat evaluation.  Since last visit, he completed therapy for his prostate cancer and basal cell carcinoma under the care of Dr. Randol Kern completed radiation without any issues.  His PSA went down from 7.4 to 0.2 which was documented in February 2023.  He has reported some hot flashes related to Eligard but no other complaints.  He denies any new skin rashes or lesions.  He denies any hospitalizations or illnesses.  He denies any frequency urgency or hesitancy. ? ? ? ?Medications: Updated on review. ?Current Outpatient Medications  ?Medication Sig Dispense Refill  ? ACCU-CHEK GUIDE test strip     ? alfuzosin (UROXATRAL) 10 MG 24 hr tablet Take 1 tablet (10 mg total) by mouth daily with supper. 90 tablet 3  ? famotidine (PEPCID) 20 MG tablet Take 20 mg by mouth at bedtime as needed for heartburn or indigestion.    ? lisinopril-hydrochlorothiazide (ZESTORETIC) 20-12.5 MG tablet Take 2 tablets by mouth daily.    ? metFORMIN (GLUCOPHAGE) 500 MG tablet Take 500 mg by mouth daily.    ? naproxen sodium (ALEVE) 220 MG tablet Take 220 mg by mouth daily as needed (pain).    ? simvastatin (ZOCOR)  40 MG tablet Take 40 mg by mouth at bedtime.    ? ?No current facility-administered medications for this visit.  ? ? ? ?Allergies: No Known Allergies ? ? ? ?Physical Exam: ?Blood pressure 135/74, pulse 93, temperature 98 ?F (36.7 ?C), temperature source Temporal, resp. rate 17, height 6' (1.829 m), weight 187 lb 1.6 oz (84.9 kg), SpO2 99 %. ? ?ECOG: 1 ? ? ?General appearance: Alert, awake without any distress. ?Head: Atraumatic without abnormalities ?Oropharynx: Without any thrush or ulcers. ?Eyes: Right medial canthus scar noted.  No masses or lesions. ?Lymph nodes: No lymphadenopathy noted in the cervical, supraclavicular, or axillary nodes ?Heart:regular rate and rhythm, without any murmurs or gallops.   ?Lung: Clear to auscultation without any rhonchi, wheezes or dullness to percussion. ?Abdomin: Soft, nontender without any shifting dullness or ascites. ?Musculoskeletal: No clubbing or cyanosis. ?Neurological: No motor or sensory deficits. ?Skin: No rashes or lesions. ? ? ? ? ? ?Lab Results: ?Lab Results  ?Component Value Date  ? WBC 8.9 05/10/2021  ? HGB 14.2 05/10/2021  ? HCT 40.9 05/10/2021  ? MCV 87.8 05/10/2021  ? PLT 201 05/10/2021  ? ?  Chemistry   ?   ?Component Value Date/Time  ? NA 137 05/10/2021 0830  ? NA 138 01/17/2021 1630  ? K 3.8 05/10/2021 0830  ? CL 99 05/10/2021 0830  ? CO2 26 05/10/2021 0830  ? BUN 19  05/10/2021 0830  ? BUN 20 01/17/2021 1630  ? CREATININE 1.07 05/10/2021 0830  ?    ?Component Value Date/Time  ? CALCIUM 9.4 05/10/2021 0830  ? ALKPHOS 74 05/10/2021 0830  ? AST 17 05/10/2021 0830  ? ALT 17 05/10/2021 0830  ? BILITOT 0.4 05/10/2021 0830  ?  ? ? ? ? ? ? ?Impression and Plan: ? ?76 year old with: ? ? ? 1.  Prostate cancer presented with PSA of 7 and has a Gleason score 4+4 = 8 in May 2022. ? ?He has completed radiation therapy to the prostate and currently on androgen deprivation.  Given his high risk disease completing close to 2 years of androgen deprivation is recommended.  He  is currently receiving that under the care of of Dr. Alyson Ingles. ? ?Additional systemic therapy for his prostate cancer may be warranted if he developed relapsed disease in the future. ? ? ? ? ? ?2.  Basal cell carcinoma of the right medial canthus diagnosed in April 2022.  He continues to follow-up with ophthalmology regarding this issue.  No systemic treatment or recurrences noted.  Surgical resection could be considered at this time. ? ?  ?3.  Follow-up: Will be as needed in the future. ?  ?  ?30  minutes were spent on this encounter.  The time was dedicated to reviewing laboratory data, disease status update, treatment choices and future plan of care review. ?  ?  ? ? ? ?Zola Button, MD ?4/11/20239:52 AM ? ?

## 2021-12-12 ENCOUNTER — Other Ambulatory Visit (HOSPITAL_COMMUNITY): Payer: Self-pay

## 2022-01-15 DIAGNOSIS — F17210 Nicotine dependence, cigarettes, uncomplicated: Secondary | ICD-10-CM | POA: Diagnosis not present

## 2022-01-15 DIAGNOSIS — L0291 Cutaneous abscess, unspecified: Secondary | ICD-10-CM | POA: Diagnosis not present

## 2022-01-15 DIAGNOSIS — Z299 Encounter for prophylactic measures, unspecified: Secondary | ICD-10-CM | POA: Diagnosis not present

## 2022-01-15 DIAGNOSIS — I10 Essential (primary) hypertension: Secondary | ICD-10-CM | POA: Diagnosis not present

## 2022-01-23 DIAGNOSIS — E1142 Type 2 diabetes mellitus with diabetic polyneuropathy: Secondary | ICD-10-CM | POA: Diagnosis not present

## 2022-01-23 DIAGNOSIS — E1165 Type 2 diabetes mellitus with hyperglycemia: Secondary | ICD-10-CM | POA: Diagnosis not present

## 2022-01-23 DIAGNOSIS — F17210 Nicotine dependence, cigarettes, uncomplicated: Secondary | ICD-10-CM | POA: Diagnosis not present

## 2022-01-23 DIAGNOSIS — Z299 Encounter for prophylactic measures, unspecified: Secondary | ICD-10-CM | POA: Diagnosis not present

## 2022-01-23 DIAGNOSIS — L723 Sebaceous cyst: Secondary | ICD-10-CM | POA: Diagnosis not present

## 2022-01-23 DIAGNOSIS — I10 Essential (primary) hypertension: Secondary | ICD-10-CM | POA: Diagnosis not present

## 2022-01-31 DIAGNOSIS — Z7984 Long term (current) use of oral hypoglycemic drugs: Secondary | ICD-10-CM | POA: Diagnosis not present

## 2022-01-31 DIAGNOSIS — C44111 Basal cell carcinoma of skin of unspecified eyelid, including canthus: Secondary | ICD-10-CM | POA: Diagnosis not present

## 2022-01-31 DIAGNOSIS — I10 Essential (primary) hypertension: Secondary | ICD-10-CM | POA: Diagnosis not present

## 2022-01-31 DIAGNOSIS — Z79899 Other long term (current) drug therapy: Secondary | ICD-10-CM | POA: Diagnosis not present

## 2022-01-31 DIAGNOSIS — F17210 Nicotine dependence, cigarettes, uncomplicated: Secondary | ICD-10-CM | POA: Diagnosis not present

## 2022-01-31 DIAGNOSIS — E7800 Pure hypercholesterolemia, unspecified: Secondary | ICD-10-CM | POA: Diagnosis not present

## 2022-01-31 DIAGNOSIS — E119 Type 2 diabetes mellitus without complications: Secondary | ICD-10-CM | POA: Diagnosis not present

## 2022-02-13 DIAGNOSIS — L089 Local infection of the skin and subcutaneous tissue, unspecified: Secondary | ICD-10-CM | POA: Diagnosis not present

## 2022-02-13 DIAGNOSIS — L729 Follicular cyst of the skin and subcutaneous tissue, unspecified: Secondary | ICD-10-CM | POA: Diagnosis not present

## 2022-02-15 DIAGNOSIS — F17210 Nicotine dependence, cigarettes, uncomplicated: Secondary | ICD-10-CM | POA: Diagnosis not present

## 2022-02-15 DIAGNOSIS — L02212 Cutaneous abscess of back [any part, except buttock]: Secondary | ICD-10-CM | POA: Diagnosis not present

## 2022-02-15 DIAGNOSIS — E7800 Pure hypercholesterolemia, unspecified: Secondary | ICD-10-CM | POA: Diagnosis not present

## 2022-02-15 DIAGNOSIS — L723 Sebaceous cyst: Secondary | ICD-10-CM | POA: Diagnosis not present

## 2022-02-15 DIAGNOSIS — E119 Type 2 diabetes mellitus without complications: Secondary | ICD-10-CM | POA: Diagnosis not present

## 2022-02-15 DIAGNOSIS — I10 Essential (primary) hypertension: Secondary | ICD-10-CM | POA: Diagnosis not present

## 2022-02-18 DIAGNOSIS — L723 Sebaceous cyst: Secondary | ICD-10-CM | POA: Diagnosis not present

## 2022-02-18 DIAGNOSIS — F17210 Nicotine dependence, cigarettes, uncomplicated: Secondary | ICD-10-CM | POA: Diagnosis not present

## 2022-02-18 DIAGNOSIS — E7800 Pure hypercholesterolemia, unspecified: Secondary | ICD-10-CM | POA: Diagnosis not present

## 2022-02-18 DIAGNOSIS — I10 Essential (primary) hypertension: Secondary | ICD-10-CM | POA: Diagnosis not present

## 2022-02-18 DIAGNOSIS — L02212 Cutaneous abscess of back [any part, except buttock]: Secondary | ICD-10-CM | POA: Diagnosis not present

## 2022-02-18 DIAGNOSIS — E119 Type 2 diabetes mellitus without complications: Secondary | ICD-10-CM | POA: Diagnosis not present

## 2022-02-20 DIAGNOSIS — L02212 Cutaneous abscess of back [any part, except buttock]: Secondary | ICD-10-CM | POA: Diagnosis not present

## 2022-02-20 DIAGNOSIS — E119 Type 2 diabetes mellitus without complications: Secondary | ICD-10-CM | POA: Diagnosis not present

## 2022-02-20 DIAGNOSIS — L723 Sebaceous cyst: Secondary | ICD-10-CM | POA: Diagnosis not present

## 2022-02-20 DIAGNOSIS — F17210 Nicotine dependence, cigarettes, uncomplicated: Secondary | ICD-10-CM | POA: Diagnosis not present

## 2022-02-20 DIAGNOSIS — I10 Essential (primary) hypertension: Secondary | ICD-10-CM | POA: Diagnosis not present

## 2022-02-20 DIAGNOSIS — E7800 Pure hypercholesterolemia, unspecified: Secondary | ICD-10-CM | POA: Diagnosis not present

## 2022-02-22 DIAGNOSIS — I10 Essential (primary) hypertension: Secondary | ICD-10-CM | POA: Diagnosis not present

## 2022-02-22 DIAGNOSIS — F17210 Nicotine dependence, cigarettes, uncomplicated: Secondary | ICD-10-CM | POA: Diagnosis not present

## 2022-02-22 DIAGNOSIS — E119 Type 2 diabetes mellitus without complications: Secondary | ICD-10-CM | POA: Diagnosis not present

## 2022-02-22 DIAGNOSIS — L02212 Cutaneous abscess of back [any part, except buttock]: Secondary | ICD-10-CM | POA: Diagnosis not present

## 2022-02-22 DIAGNOSIS — L723 Sebaceous cyst: Secondary | ICD-10-CM | POA: Diagnosis not present

## 2022-02-22 DIAGNOSIS — E7800 Pure hypercholesterolemia, unspecified: Secondary | ICD-10-CM | POA: Diagnosis not present

## 2022-02-25 DIAGNOSIS — E119 Type 2 diabetes mellitus without complications: Secondary | ICD-10-CM | POA: Diagnosis not present

## 2022-02-25 DIAGNOSIS — F17210 Nicotine dependence, cigarettes, uncomplicated: Secondary | ICD-10-CM | POA: Diagnosis not present

## 2022-02-25 DIAGNOSIS — L723 Sebaceous cyst: Secondary | ICD-10-CM | POA: Diagnosis not present

## 2022-02-25 DIAGNOSIS — L02212 Cutaneous abscess of back [any part, except buttock]: Secondary | ICD-10-CM | POA: Diagnosis not present

## 2022-02-25 DIAGNOSIS — I10 Essential (primary) hypertension: Secondary | ICD-10-CM | POA: Diagnosis not present

## 2022-02-25 DIAGNOSIS — E7800 Pure hypercholesterolemia, unspecified: Secondary | ICD-10-CM | POA: Diagnosis not present

## 2022-02-27 DIAGNOSIS — E119 Type 2 diabetes mellitus without complications: Secondary | ICD-10-CM | POA: Diagnosis not present

## 2022-02-27 DIAGNOSIS — L723 Sebaceous cyst: Secondary | ICD-10-CM | POA: Diagnosis not present

## 2022-02-27 DIAGNOSIS — E7800 Pure hypercholesterolemia, unspecified: Secondary | ICD-10-CM | POA: Diagnosis not present

## 2022-02-27 DIAGNOSIS — F17210 Nicotine dependence, cigarettes, uncomplicated: Secondary | ICD-10-CM | POA: Diagnosis not present

## 2022-02-27 DIAGNOSIS — I10 Essential (primary) hypertension: Secondary | ICD-10-CM | POA: Diagnosis not present

## 2022-02-27 DIAGNOSIS — L02212 Cutaneous abscess of back [any part, except buttock]: Secondary | ICD-10-CM | POA: Diagnosis not present

## 2022-03-01 DIAGNOSIS — F17210 Nicotine dependence, cigarettes, uncomplicated: Secondary | ICD-10-CM | POA: Diagnosis not present

## 2022-03-01 DIAGNOSIS — L089 Local infection of the skin and subcutaneous tissue, unspecified: Secondary | ICD-10-CM | POA: Diagnosis not present

## 2022-03-01 DIAGNOSIS — L729 Follicular cyst of the skin and subcutaneous tissue, unspecified: Secondary | ICD-10-CM | POA: Diagnosis not present

## 2022-03-01 DIAGNOSIS — L723 Sebaceous cyst: Secondary | ICD-10-CM | POA: Diagnosis not present

## 2022-03-01 DIAGNOSIS — L02212 Cutaneous abscess of back [any part, except buttock]: Secondary | ICD-10-CM | POA: Diagnosis not present

## 2022-03-01 DIAGNOSIS — E119 Type 2 diabetes mellitus without complications: Secondary | ICD-10-CM | POA: Diagnosis not present

## 2022-03-01 DIAGNOSIS — I10 Essential (primary) hypertension: Secondary | ICD-10-CM | POA: Diagnosis not present

## 2022-03-01 DIAGNOSIS — E7800 Pure hypercholesterolemia, unspecified: Secondary | ICD-10-CM | POA: Diagnosis not present

## 2022-03-04 DIAGNOSIS — F17210 Nicotine dependence, cigarettes, uncomplicated: Secondary | ICD-10-CM | POA: Diagnosis not present

## 2022-03-04 DIAGNOSIS — L02212 Cutaneous abscess of back [any part, except buttock]: Secondary | ICD-10-CM | POA: Diagnosis not present

## 2022-03-04 DIAGNOSIS — I10 Essential (primary) hypertension: Secondary | ICD-10-CM | POA: Diagnosis not present

## 2022-03-04 DIAGNOSIS — E119 Type 2 diabetes mellitus without complications: Secondary | ICD-10-CM | POA: Diagnosis not present

## 2022-03-04 DIAGNOSIS — E7800 Pure hypercholesterolemia, unspecified: Secondary | ICD-10-CM | POA: Diagnosis not present

## 2022-03-04 DIAGNOSIS — L723 Sebaceous cyst: Secondary | ICD-10-CM | POA: Diagnosis not present

## 2022-03-07 DIAGNOSIS — L02212 Cutaneous abscess of back [any part, except buttock]: Secondary | ICD-10-CM | POA: Diagnosis not present

## 2022-03-07 DIAGNOSIS — L723 Sebaceous cyst: Secondary | ICD-10-CM | POA: Diagnosis not present

## 2022-03-07 DIAGNOSIS — F17210 Nicotine dependence, cigarettes, uncomplicated: Secondary | ICD-10-CM | POA: Diagnosis not present

## 2022-03-07 DIAGNOSIS — E7800 Pure hypercholesterolemia, unspecified: Secondary | ICD-10-CM | POA: Diagnosis not present

## 2022-03-07 DIAGNOSIS — E119 Type 2 diabetes mellitus without complications: Secondary | ICD-10-CM | POA: Diagnosis not present

## 2022-03-07 DIAGNOSIS — I10 Essential (primary) hypertension: Secondary | ICD-10-CM | POA: Diagnosis not present

## 2022-03-11 DIAGNOSIS — L02212 Cutaneous abscess of back [any part, except buttock]: Secondary | ICD-10-CM | POA: Diagnosis not present

## 2022-03-11 DIAGNOSIS — L723 Sebaceous cyst: Secondary | ICD-10-CM | POA: Diagnosis not present

## 2022-03-11 DIAGNOSIS — I10 Essential (primary) hypertension: Secondary | ICD-10-CM | POA: Diagnosis not present

## 2022-03-11 DIAGNOSIS — E7800 Pure hypercholesterolemia, unspecified: Secondary | ICD-10-CM | POA: Diagnosis not present

## 2022-03-11 DIAGNOSIS — E119 Type 2 diabetes mellitus without complications: Secondary | ICD-10-CM | POA: Diagnosis not present

## 2022-03-11 DIAGNOSIS — F17210 Nicotine dependence, cigarettes, uncomplicated: Secondary | ICD-10-CM | POA: Diagnosis not present

## 2022-03-14 DIAGNOSIS — L02212 Cutaneous abscess of back [any part, except buttock]: Secondary | ICD-10-CM | POA: Diagnosis not present

## 2022-03-14 DIAGNOSIS — E119 Type 2 diabetes mellitus without complications: Secondary | ICD-10-CM | POA: Diagnosis not present

## 2022-03-14 DIAGNOSIS — I10 Essential (primary) hypertension: Secondary | ICD-10-CM | POA: Diagnosis not present

## 2022-03-14 DIAGNOSIS — E7800 Pure hypercholesterolemia, unspecified: Secondary | ICD-10-CM | POA: Diagnosis not present

## 2022-03-14 DIAGNOSIS — L723 Sebaceous cyst: Secondary | ICD-10-CM | POA: Diagnosis not present

## 2022-03-14 DIAGNOSIS — F17210 Nicotine dependence, cigarettes, uncomplicated: Secondary | ICD-10-CM | POA: Diagnosis not present

## 2022-03-18 DIAGNOSIS — Z85828 Personal history of other malignant neoplasm of skin: Secondary | ICD-10-CM | POA: Diagnosis not present

## 2022-03-18 DIAGNOSIS — I10 Essential (primary) hypertension: Secondary | ICD-10-CM | POA: Diagnosis not present

## 2022-03-18 DIAGNOSIS — E7800 Pure hypercholesterolemia, unspecified: Secondary | ICD-10-CM | POA: Diagnosis not present

## 2022-03-18 DIAGNOSIS — L729 Follicular cyst of the skin and subcutaneous tissue, unspecified: Secondary | ICD-10-CM | POA: Diagnosis not present

## 2022-03-18 DIAGNOSIS — Z923 Personal history of irradiation: Secondary | ICD-10-CM | POA: Diagnosis not present

## 2022-03-18 DIAGNOSIS — L089 Local infection of the skin and subcutaneous tissue, unspecified: Secondary | ICD-10-CM | POA: Diagnosis not present

## 2022-03-18 DIAGNOSIS — Z79899 Other long term (current) drug therapy: Secondary | ICD-10-CM | POA: Diagnosis not present

## 2022-03-18 DIAGNOSIS — F17210 Nicotine dependence, cigarettes, uncomplicated: Secondary | ICD-10-CM | POA: Diagnosis not present

## 2022-03-18 DIAGNOSIS — E119 Type 2 diabetes mellitus without complications: Secondary | ICD-10-CM | POA: Diagnosis not present

## 2022-03-18 DIAGNOSIS — Z7984 Long term (current) use of oral hypoglycemic drugs: Secondary | ICD-10-CM | POA: Diagnosis not present

## 2022-03-20 DIAGNOSIS — R5383 Other fatigue: Secondary | ICD-10-CM | POA: Diagnosis not present

## 2022-03-20 DIAGNOSIS — Z7189 Other specified counseling: Secondary | ICD-10-CM | POA: Diagnosis not present

## 2022-03-20 DIAGNOSIS — Z79899 Other long term (current) drug therapy: Secondary | ICD-10-CM | POA: Diagnosis not present

## 2022-03-20 DIAGNOSIS — E7800 Pure hypercholesterolemia, unspecified: Secondary | ICD-10-CM | POA: Diagnosis not present

## 2022-03-20 DIAGNOSIS — Z299 Encounter for prophylactic measures, unspecified: Secondary | ICD-10-CM | POA: Diagnosis not present

## 2022-03-20 DIAGNOSIS — Z0000 Encounter for general adult medical examination without abnormal findings: Secondary | ICD-10-CM | POA: Diagnosis not present

## 2022-03-20 DIAGNOSIS — I10 Essential (primary) hypertension: Secondary | ICD-10-CM | POA: Diagnosis not present

## 2022-03-21 DIAGNOSIS — Z85828 Personal history of other malignant neoplasm of skin: Secondary | ICD-10-CM | POA: Diagnosis not present

## 2022-03-21 DIAGNOSIS — L729 Follicular cyst of the skin and subcutaneous tissue, unspecified: Secondary | ICD-10-CM | POA: Diagnosis not present

## 2022-03-21 DIAGNOSIS — Z7984 Long term (current) use of oral hypoglycemic drugs: Secondary | ICD-10-CM | POA: Diagnosis not present

## 2022-03-21 DIAGNOSIS — I10 Essential (primary) hypertension: Secondary | ICD-10-CM | POA: Diagnosis not present

## 2022-03-21 DIAGNOSIS — L089 Local infection of the skin and subcutaneous tissue, unspecified: Secondary | ICD-10-CM | POA: Diagnosis not present

## 2022-03-21 DIAGNOSIS — Z923 Personal history of irradiation: Secondary | ICD-10-CM | POA: Diagnosis not present

## 2022-03-21 DIAGNOSIS — E119 Type 2 diabetes mellitus without complications: Secondary | ICD-10-CM | POA: Diagnosis not present

## 2022-03-21 DIAGNOSIS — F17210 Nicotine dependence, cigarettes, uncomplicated: Secondary | ICD-10-CM | POA: Diagnosis not present

## 2022-03-21 DIAGNOSIS — Z79899 Other long term (current) drug therapy: Secondary | ICD-10-CM | POA: Diagnosis not present

## 2022-03-21 DIAGNOSIS — E7800 Pure hypercholesterolemia, unspecified: Secondary | ICD-10-CM | POA: Diagnosis not present

## 2022-04-05 DIAGNOSIS — E7800 Pure hypercholesterolemia, unspecified: Secondary | ICD-10-CM | POA: Diagnosis not present

## 2022-04-05 DIAGNOSIS — Z85828 Personal history of other malignant neoplasm of skin: Secondary | ICD-10-CM | POA: Diagnosis not present

## 2022-04-05 DIAGNOSIS — L089 Local infection of the skin and subcutaneous tissue, unspecified: Secondary | ICD-10-CM | POA: Diagnosis not present

## 2022-04-05 DIAGNOSIS — I10 Essential (primary) hypertension: Secondary | ICD-10-CM | POA: Diagnosis not present

## 2022-04-05 DIAGNOSIS — Z7984 Long term (current) use of oral hypoglycemic drugs: Secondary | ICD-10-CM | POA: Diagnosis not present

## 2022-04-05 DIAGNOSIS — Z79899 Other long term (current) drug therapy: Secondary | ICD-10-CM | POA: Diagnosis not present

## 2022-04-05 DIAGNOSIS — L729 Follicular cyst of the skin and subcutaneous tissue, unspecified: Secondary | ICD-10-CM | POA: Diagnosis not present

## 2022-04-05 DIAGNOSIS — Z923 Personal history of irradiation: Secondary | ICD-10-CM | POA: Diagnosis not present

## 2022-04-05 DIAGNOSIS — E119 Type 2 diabetes mellitus without complications: Secondary | ICD-10-CM | POA: Diagnosis not present

## 2022-04-05 DIAGNOSIS — F17210 Nicotine dependence, cigarettes, uncomplicated: Secondary | ICD-10-CM | POA: Diagnosis not present

## 2022-04-08 ENCOUNTER — Other Ambulatory Visit: Payer: Medicare Other

## 2022-04-08 DIAGNOSIS — C61 Malignant neoplasm of prostate: Secondary | ICD-10-CM

## 2022-04-09 LAB — PSA: Prostate Specific Ag, Serum: 0.1 ng/mL (ref 0.0–4.0)

## 2022-04-15 ENCOUNTER — Encounter: Payer: Self-pay | Admitting: Urology

## 2022-04-15 ENCOUNTER — Ambulatory Visit: Payer: Medicare Other | Admitting: Urology

## 2022-04-15 VITALS — BP 178/75 | HR 98

## 2022-04-15 DIAGNOSIS — N138 Other obstructive and reflux uropathy: Secondary | ICD-10-CM | POA: Diagnosis not present

## 2022-04-15 DIAGNOSIS — C61 Malignant neoplasm of prostate: Secondary | ICD-10-CM

## 2022-04-15 DIAGNOSIS — R351 Nocturia: Secondary | ICD-10-CM | POA: Diagnosis not present

## 2022-04-15 DIAGNOSIS — N401 Enlarged prostate with lower urinary tract symptoms: Secondary | ICD-10-CM

## 2022-04-15 DIAGNOSIS — R339 Retention of urine, unspecified: Secondary | ICD-10-CM | POA: Diagnosis not present

## 2022-04-15 LAB — URINALYSIS, ROUTINE W REFLEX MICROSCOPIC
Bilirubin, UA: NEGATIVE
Glucose, UA: NEGATIVE
Ketones, UA: NEGATIVE
Leukocytes,UA: NEGATIVE
Nitrite, UA: NEGATIVE
Protein,UA: NEGATIVE
RBC, UA: NEGATIVE
Specific Gravity, UA: 1.02 (ref 1.005–1.030)
Urobilinogen, Ur: 0.2 mg/dL (ref 0.2–1.0)
pH, UA: 5.5 (ref 5.0–7.5)

## 2022-04-15 MED ORDER — LEUPROLIDE ACETATE (6 MONTH) 45 MG ~~LOC~~ KIT
45.0000 mg | PACK | Freq: Once | SUBCUTANEOUS | Status: AC
  Administered 2022-04-15: 45 mg via SUBCUTANEOUS

## 2022-04-15 MED ORDER — ALFUZOSIN HCL ER 10 MG PO TB24: 10.0000 mg | ORAL_TABLET | Freq: Every day | ORAL | 3 refills | Status: DC

## 2022-04-15 NOTE — Patient Instructions (Signed)

## 2022-04-15 NOTE — Progress Notes (Unsigned)
Eligard SubQ Injection   Due to Prostate Cancer patient is present today for a Eligard Injection.  Medication: Eligard 6 month Dose: 45 mg  Location: right  Lot: [id number] Exp: 06/20/2023  Patient tolerated well, no complications were noted  Performed by: Levi Aland, CMA

## 2022-04-15 NOTE — Progress Notes (Unsigned)
04/15/2022 8:56 AM   [name] September 12, 1945 [id number]  Referring provider: Monico Blitz, MD [address] [place],  [place] [address]  Followup prostate cancer and BPH   HPI: Mr Meech is a 76yo here for followup for prostate cancer and BPH. PSA decreased to 0.1 on ADT. IPSS 10 QOL 2 on uroxatral '10mg'$  qhs. Nocturia 1-2x. Urine stream strong. No straining to urinate. Mild hot flahses that do not bother him.    PMH: Past Medical History:  Diagnosis Date   BPH (benign prostatic hyperplasia)    Cancer ([place])    Diabetes mellitus without complication ([place])    High cholesterol    Hypertension     Surgical History: Past Surgical History:  Procedure Laterality Date   CATARACT EXTRACTION W/PHACO Left 12/11/2020   Procedure: CATARACT EXTRACTION PHACO AND INTRAOCULAR LENS PLACEMENT LEFT EYE;  Surgeon: Baruch Goldmann, MD;  Location: [facility];  Service: Ophthalmology;  Laterality: Left;  left CDE=13.96   EVALUATION UNDER ANESTHESIA WITH TEAR DUCT PROBING     GOLD SEED IMPLANT N/A 04/26/2021   Procedure: GOLD SEED IMPLANT;  Surgeon: Cleon Gustin, MD;  Location: [facility];  Service: Urology;  Laterality: N/A;   SPACE OAR INSTILLATION N/A 04/26/2021   Procedure: SPACE OAR INSTILLATION;  Surgeon: Cleon Gustin, MD;  Location: [facility];  Service: Urology;  Laterality: N/A;   TONSILLECTOMY      Home Medications:  Allergies as of 04/15/2022   No Known Allergies      Medication List        Accurate as of April 15, 2022  8:56 AM. If you have any questions, ask your nurse or doctor.          Accu-Chek Guide test strip Generic drug: glucose blood   alfuzosin 10 MG 24 hr tablet Commonly known as: UROXATRAL Take 1 tablet (10 mg total) by mouth daily with supper.   famotidine 20 MG tablet Commonly known as: PEPCID Take 20 mg by mouth at bedtime as needed for heartburn or indigestion.   lisinopril-hydrochlorothiazide 20-12.5 MG tablet Commonly known as: ZESTORETIC Take  2 tablets by mouth daily.   metFORMIN 500 MG tablet Commonly known as: GLUCOPHAGE Take 500 mg by mouth daily.   naproxen sodium 220 MG tablet Commonly known as: ALEVE Take 220 mg by mouth daily as needed (pain).   simvastatin 40 MG tablet Commonly known as: ZOCOR Take 40 mg by mouth at bedtime.        Allergies: No Known Allergies  Family History: No family history on file.  Social History:  reports that he has been smoking cigarettes. He has a 30.00 pack-year smoking history. He has never used smokeless tobacco. He reports that he does not currently use alcohol. He reports that he does not use drugs.  ROS: All other review of systems were reviewed and are negative except what is noted above in HPI  Physical Exam: BP (!) 178/75   Pulse 98   Constitutional:  Alert and oriented, No acute distress. HEENT: [place] AT, moist mucus membranes.  Trachea midline, no masses. Cardiovascular: No clubbing, cyanosis, or edema. Respiratory: Normal respiratory effort, no increased work of breathing. GI: Abdomen is soft, nontender, nondistended, no abdominal masses GU: No CVA tenderness.  Lymph: No cervical or inguinal lymphadenopathy. Skin: No rashes, bruises or suspicious lesions. Neurologic: Grossly intact, no focal deficits, moving all 4 extremities. Psychiatric: Normal mood and affect.  Laboratory Data: Lab Results  Component Value Date   WBC 8.9  05/10/2021   HGB 14.2 05/10/2021   HCT 40.9 05/10/2021   MCV 87.8 05/10/2021   PLT 201 05/10/2021    Lab Results  Component Value Date   CREATININE 1.07 05/10/2021    No results found for: "PSA"  No results found for: "TESTOSTERONE"  Lab Results  Component Value Date   HGBA1C 7.2 (H) 04/24/2021    Urinalysis    Component Value Date/Time   APPEARANCEUR Clear 10/15/2021 1051   GLUCOSEU Negative 10/15/2021 1051   BILIRUBINUR Negative 10/15/2021 1051   PROTEINUR Negative 10/15/2021 1051   NITRITE Negative 10/15/2021 1051    LEUKOCYTESUR Negative 10/15/2021 1051    Lab Results  Component Value Date   LABMICR Comment 10/15/2021   WBCUA None seen 05/11/2021   LABEPIT 0-10 05/11/2021   MUCUS Present 05/11/2021   BACTERIA None seen 05/11/2021    Pertinent Imaging:  No results found for this or any previous visit.  No results found for this or any previous visit.  No results found for this or any previous visit.  No results found for this or any previous visit.  No results found for this or any previous visit.  No results found for this or any previous visit.  No results found for this or any previous visit.  No results found for this or any previous visit.   Assessment & Plan:    1. Prostate cancer ([place]) -RTC 6 months for PSA and eligard - leuprolide (6 Month) (ELIGARD) injection 45 mg - Urinalysis, Routine w reflex microscopic  2. Benign prostatic hyperplasia with urinary obstruction -Continue uroxatral '10mg'$   3. Nocturia -continue uroxatral '10mg'$  qhs   No follow-ups on file.  Nicolette Bang, MD  [facility] [place]

## 2022-04-18 DIAGNOSIS — E1142 Type 2 diabetes mellitus with diabetic polyneuropathy: Secondary | ICD-10-CM | POA: Diagnosis not present

## 2022-04-18 DIAGNOSIS — K219 Gastro-esophageal reflux disease without esophagitis: Secondary | ICD-10-CM | POA: Diagnosis not present

## 2022-04-18 DIAGNOSIS — I10 Essential (primary) hypertension: Secondary | ICD-10-CM | POA: Diagnosis not present

## 2022-04-18 DIAGNOSIS — E7849 Other hyperlipidemia: Secondary | ICD-10-CM | POA: Diagnosis not present

## 2022-04-30 DIAGNOSIS — I10 Essential (primary) hypertension: Secondary | ICD-10-CM | POA: Diagnosis not present

## 2022-04-30 DIAGNOSIS — Z2821 Immunization not carried out because of patient refusal: Secondary | ICD-10-CM | POA: Diagnosis not present

## 2022-04-30 DIAGNOSIS — E7800 Pure hypercholesterolemia, unspecified: Secondary | ICD-10-CM | POA: Diagnosis not present

## 2022-04-30 DIAGNOSIS — Z0000 Encounter for general adult medical examination without abnormal findings: Secondary | ICD-10-CM | POA: Diagnosis not present

## 2022-04-30 DIAGNOSIS — E1165 Type 2 diabetes mellitus with hyperglycemia: Secondary | ICD-10-CM | POA: Diagnosis not present

## 2022-04-30 DIAGNOSIS — Z299 Encounter for prophylactic measures, unspecified: Secondary | ICD-10-CM | POA: Diagnosis not present

## 2022-05-09 DIAGNOSIS — L720 Epidermal cyst: Secondary | ICD-10-CM | POA: Diagnosis not present

## 2022-05-22 DIAGNOSIS — L729 Follicular cyst of the skin and subcutaneous tissue, unspecified: Secondary | ICD-10-CM | POA: Diagnosis not present

## 2022-05-22 DIAGNOSIS — L089 Local infection of the skin and subcutaneous tissue, unspecified: Secondary | ICD-10-CM | POA: Diagnosis not present

## 2022-06-05 DIAGNOSIS — L089 Local infection of the skin and subcutaneous tissue, unspecified: Secondary | ICD-10-CM | POA: Diagnosis not present

## 2022-06-05 DIAGNOSIS — L729 Follicular cyst of the skin and subcutaneous tissue, unspecified: Secondary | ICD-10-CM | POA: Diagnosis not present

## 2022-06-06 ENCOUNTER — Encounter: Payer: Self-pay | Admitting: *Deleted

## 2022-06-06 ENCOUNTER — Telehealth: Payer: Self-pay | Admitting: *Deleted

## 2022-06-06 NOTE — Patient Outreach (Signed)
  Care Coordination   Initial Visit Note   06/06/2022 Name: Caleb Pugh MRN: [id number] DOB: 13-Mar-1946  Caleb Pugh is a 76 y.o. year old male who sees Monico Blitz, MD for primary care. I spoke with  [name] by phone today.  What matters to the patients health and wellness today?  Exercises and monitors diet for DM management.  Does not request follow up calls at this time.     Goals Addressed             This Visit's Progress    COMPLETED: Care Coordination Activities - No follow up needed       Care Coordination Interventions: Provided education to patient about basic DM disease process Discussed plans with patient for ongoing care management follow up and provided patient with direct contact information for care management team Reviewed scheduled/upcoming provider appointments including: PCP visit every 3 months, last on 9/12 for [facility] Advised patient, providing education and rationale, to check cbg daily and record, calling provider for findings outside established parameters Assessed social determinant of health barriers         SDOH assessments and interventions completed:  Yes  SDOH Interventions Today    Flowsheet Row Most Recent Value  SDOH Interventions   Food Insecurity Interventions Intervention Not Indicated  Housing Interventions Intervention Not Indicated  Transportation Interventions Intervention Not Indicated  Utilities Interventions Intervention Not Indicated        Care Coordination Interventions Activated:  No  Care Coordination Interventions:  Yes, provided   Follow up plan: No further intervention required.   Encounter Outcome:  Pt. Visit Completed   Valente David, RN ,MSN, [place] Care Management Care Management Coordinator [phone]

## 2022-06-06 NOTE — Patient Instructions (Signed)
Visit Information  Thank you for taking time to visit with me today. Please don't hesitate to contact me if I can be of assistance to you.  Following are the goals we discussed today:  Continue to monitor blood sugar daily. Call this RNCM with questions.  Please call the Suicide and Crisis Lifeline: 988 call the [place] National Suicide Prevention Lifeline: 2136955255 or TTY: 2601546043 TTY [phone]) to talk to a trained counselor call 1-800-273-TALK (toll free, 24 hour hotline) call the [facility]: [phone] call 911 if you are experiencing a Mental Health or [place] or need someone to talk to.  Patient verbalizes understanding of instructions and care plan provided today and agrees to view in [place]. Active MyChart status and patient understanding of how to access instructions and care plan via MyChart confirmed with patient.     The patient has been provided with contact information for the care management team and has been advised to call with any health related questions or concerns.   Valente David, RN, MSN, [place] Care Management Care Management Coordinator [phone]

## 2022-08-02 DIAGNOSIS — E1165 Type 2 diabetes mellitus with hyperglycemia: Secondary | ICD-10-CM | POA: Diagnosis not present

## 2022-08-02 DIAGNOSIS — I10 Essential (primary) hypertension: Secondary | ICD-10-CM | POA: Diagnosis not present

## 2022-08-02 DIAGNOSIS — F17210 Nicotine dependence, cigarettes, uncomplicated: Secondary | ICD-10-CM | POA: Diagnosis not present

## 2022-08-02 DIAGNOSIS — Z299 Encounter for prophylactic measures, unspecified: Secondary | ICD-10-CM | POA: Diagnosis not present

## 2022-10-09 ENCOUNTER — Other Ambulatory Visit: Payer: Self-pay

## 2022-10-09 ENCOUNTER — Other Ambulatory Visit: Payer: Medicare Other

## 2022-10-09 DIAGNOSIS — C61 Malignant neoplasm of prostate: Secondary | ICD-10-CM

## 2022-10-10 LAB — PSA: Prostate Specific Ag, Serum: <0.1 ng/mL (ref 0.0–4.0)

## 2022-10-16 ENCOUNTER — Ambulatory Visit: Payer: Medicare Other | Admitting: Urology

## 2022-10-16 VITALS — BP 116/66 | HR 105

## 2022-10-16 DIAGNOSIS — N138 Other obstructive and reflux uropathy: Secondary | ICD-10-CM

## 2022-10-16 DIAGNOSIS — R351 Nocturia: Secondary | ICD-10-CM

## 2022-10-16 DIAGNOSIS — R339 Retention of urine, unspecified: Secondary | ICD-10-CM

## 2022-10-16 DIAGNOSIS — N401 Enlarged prostate with lower urinary tract symptoms: Secondary | ICD-10-CM

## 2022-10-16 DIAGNOSIS — C61 Malignant neoplasm of prostate: Secondary | ICD-10-CM | POA: Diagnosis not present

## 2022-10-16 MED ORDER — LEUPROLIDE ACETATE (6 MONTH) 45 MG ~~LOC~~ KIT
45.0000 mg | PACK | Freq: Once | SUBCUTANEOUS | Status: AC
  Administered 2022-10-16: 45 mg via SUBCUTANEOUS

## 2022-10-16 MED ORDER — ALFUZOSIN HCL ER 10 MG PO TB24: 10.0000 mg | ORAL_TABLET | Freq: Every day | ORAL | 3 refills | Status: DC

## 2022-10-16 NOTE — Progress Notes (Signed)
10/16/2022 10:13 AM   Caleb Pugh 05/04/1946 [id number]  Referring provider: Monico Blitz, MD [address] [place],  [place] [address]  Followup prostate cancer and BPH with nocturia   HPI: PSA undetectable. IPSS 6 QOl 1 on uroxatral '10mg'$  qhs. Nocturia 1-2x. Urine stream usually strong.   PMH: Past Medical History:  Diagnosis Date   BPH (benign prostatic hyperplasia)    Cancer ([place])    Diabetes mellitus without complication ([place])    High cholesterol    Hypertension     Surgical History: Past Surgical History:  Procedure Laterality Date   CATARACT EXTRACTION W/PHACO Left 12/11/2020   Procedure: CATARACT EXTRACTION PHACO AND INTRAOCULAR LENS PLACEMENT LEFT EYE;  Surgeon: Baruch Goldmann, MD;  Location: [facility];  Service: Ophthalmology;  Laterality: Left;  left CDE=13.96   EVALUATION UNDER ANESTHESIA WITH TEAR DUCT PROBING     GOLD SEED IMPLANT N/A 04/26/2021   Procedure: GOLD SEED IMPLANT;  Surgeon: Cleon Gustin, MD;  Location: [facility];  Service: Urology;  Laterality: N/A;   SPACE OAR INSTILLATION N/A 04/26/2021   Procedure: SPACE OAR INSTILLATION;  Surgeon: Cleon Gustin, MD;  Location: [facility];  Service: Urology;  Laterality: N/A;   TONSILLECTOMY      Home Medications:  Allergies as of 10/16/2022   No Known Allergies      Medication List        Accurate as of October 16, 2022 10:13 AM. If you have any questions, ask your nurse or doctor.          Accu-Chek Guide test strip Generic drug: glucose blood   alfuzosin 10 MG 24 hr tablet Commonly known as: UROXATRAL Take 1 tablet (10 mg total) by mouth daily with supper.   famotidine 20 MG tablet Commonly known as: PEPCID Take 20 mg by mouth at bedtime as needed for heartburn or indigestion.   lisinopril-hydrochlorothiazide 20-12.5 MG tablet Commonly known as: ZESTORETIC Take 2 tablets by mouth daily.   metFORMIN 500 MG tablet Commonly known as: GLUCOPHAGE Take 500 mg by mouth daily.    naproxen sodium 220 MG tablet Commonly known as: ALEVE Take 220 mg by mouth daily as needed (pain).   simvastatin 40 MG tablet Commonly known as: ZOCOR Take 40 mg by mouth at bedtime.        Allergies: No Known Allergies  Family History: No family history on file.  Social History:  reports that he has been smoking cigarettes. He has a 30.00 pack-year smoking history. He has never used smokeless tobacco. He reports that he does not currently use alcohol. He reports that he does not use drugs.  ROS: All other review of systems were reviewed and are negative except what is noted above in HPI  Physical Exam: BP 116/66   Pulse (!) 105   Constitutional:  Alert and oriented, No acute distress. HEENT: [place] AT, moist mucus membranes.  Trachea midline, no masses. Cardiovascular: No clubbing, cyanosis, or edema. Respiratory: Normal respiratory effort, no increased work of breathing. GI: Abdomen is soft, nontender, nondistended, no abdominal masses GU: No CVA tenderness.  Lymph: No cervical or inguinal lymphadenopathy. Skin: No rashes, bruises or suspicious lesions. Neurologic: Grossly intact, no focal deficits, moving all 4 extremities. Psychiatric: Normal mood and affect.  Laboratory Data: Lab Results  Component Value Date   WBC 8.9 05/10/2021   HGB 14.2 05/10/2021   HCT 40.9 05/10/2021   MCV 87.8 05/10/2021   PLT 201 05/10/2021    Lab Results  Component  Value Date   CREATININE 1.07 05/10/2021    No results found for: "PSA"  No results found for: "TESTOSTERONE"  Lab Results  Component Value Date   HGBA1C 7.2 (H) 04/24/2021    Urinalysis    Component Value Date/Time   APPEARANCEUR Clear 04/15/2022 0831   GLUCOSEU Negative 04/15/2022 0831   BILIRUBINUR Negative 04/15/2022 0831   PROTEINUR Negative 04/15/2022 0831   NITRITE Negative 04/15/2022 0831   LEUKOCYTESUR Negative 04/15/2022 0831    Lab Results  Component Value Date   LABMICR Comment 04/15/2022    WBCUA None seen 05/11/2021   LABEPIT 0-10 05/11/2021   MUCUS Present 05/11/2021   BACTERIA None seen 05/11/2021    Pertinent Imaging: *** No results found for this or any previous visit.  No results found for this or any previous visit.  No results found for this or any previous visit.  No results found for this or any previous visit.  No results found for this or any previous visit.  No valid procedures specified. No results found for this or any previous visit.  No results found for this or any previous visit.   Assessment & Plan:    1. Prostate cancer ([facility]) Followup 6 months with PSA. Patient has complete ADT with the dose of eligard today - leuprolide (6 Month) (ELIGARD) injection 45 mg - Urinalysis, Routine w reflex microscopic  2. Benign prostatic hyperplasia with urinary obstruction ***  3. Nocturia ***   No follow-ups on file.  Nicolette Bang, MD  [facility] [place]

## 2022-10-16 NOTE — Patient Instructions (Signed)

## 2022-10-16 NOTE — Progress Notes (Signed)
Eligard SubQ Injection   Due to Prostate Cancer patient is present today for a Eligard Injection.  Medication: Eligard 6 month Dose: 45 mg  Location: left  Lot: [id number] Exp: 08/2023  Patient tolerated well, no complications were noted  Performed by: Levi Aland, CMA

## 2022-10-17 ENCOUNTER — Encounter: Payer: Self-pay | Admitting: Urology

## 2022-10-17 LAB — URINALYSIS, ROUTINE W REFLEX MICROSCOPIC
Bilirubin, UA: NEGATIVE
Ketones, UA: NEGATIVE
Leukocytes,UA: NEGATIVE
Nitrite, UA: NEGATIVE
Protein,UA: NEGATIVE
RBC, UA: NEGATIVE
Specific Gravity, UA: 1.02 (ref 1.005–1.030)
Urobilinogen, Ur: 0.2 mg/dL (ref 0.2–1.0)
pH, UA: 6.5 (ref 5.0–7.5)

## 2022-10-17 NOTE — Progress Notes (Signed)
Seen in office 2/28

## 2022-11-07 DIAGNOSIS — Z299 Encounter for prophylactic measures, unspecified: Secondary | ICD-10-CM | POA: Diagnosis not present

## 2022-11-07 DIAGNOSIS — E1165 Type 2 diabetes mellitus with hyperglycemia: Secondary | ICD-10-CM | POA: Diagnosis not present

## 2022-11-07 DIAGNOSIS — I10 Essential (primary) hypertension: Secondary | ICD-10-CM | POA: Diagnosis not present

## 2022-11-07 DIAGNOSIS — I700 Atherosclerosis of aorta: Secondary | ICD-10-CM | POA: Diagnosis not present

## 2022-12-31 ENCOUNTER — Other Ambulatory Visit: Payer: Medicare Other

## 2022-12-31 DIAGNOSIS — C61 Malignant neoplasm of prostate: Secondary | ICD-10-CM

## 2023-01-01 LAB — PSA: Prostate Specific Ag, Serum: <0.1 ng/mL (ref 0.0–4.0)

## 2023-01-07 ENCOUNTER — Other Ambulatory Visit: Payer: Self-pay

## 2023-01-07 DIAGNOSIS — C61 Malignant neoplasm of prostate: Secondary | ICD-10-CM

## 2023-01-07 MED ORDER — LEUPROLIDE ACETATE (6 MONTH) 45 MG ~~LOC~~ KIT: 45.0000 mg | PACK | SUBCUTANEOUS | Status: AC

## 2023-01-23 DIAGNOSIS — Z7189 Other specified counseling: Secondary | ICD-10-CM | POA: Diagnosis not present

## 2023-01-23 DIAGNOSIS — I10 Essential (primary) hypertension: Secondary | ICD-10-CM | POA: Diagnosis not present

## 2023-01-23 DIAGNOSIS — E1165 Type 2 diabetes mellitus with hyperglycemia: Secondary | ICD-10-CM | POA: Diagnosis not present

## 2023-01-23 DIAGNOSIS — I700 Atherosclerosis of aorta: Secondary | ICD-10-CM | POA: Diagnosis not present

## 2023-01-23 DIAGNOSIS — Z299 Encounter for prophylactic measures, unspecified: Secondary | ICD-10-CM | POA: Diagnosis not present

## 2023-01-23 DIAGNOSIS — Z0000 Encounter for general adult medical examination without abnormal findings: Secondary | ICD-10-CM | POA: Diagnosis not present

## 2023-03-25 DIAGNOSIS — Z0000 Encounter for general adult medical examination without abnormal findings: Secondary | ICD-10-CM | POA: Diagnosis not present

## 2023-03-25 DIAGNOSIS — E1165 Type 2 diabetes mellitus with hyperglycemia: Secondary | ICD-10-CM | POA: Diagnosis not present

## 2023-03-25 DIAGNOSIS — I700 Atherosclerosis of aorta: Secondary | ICD-10-CM | POA: Diagnosis not present

## 2023-03-25 DIAGNOSIS — R5383 Other fatigue: Secondary | ICD-10-CM | POA: Diagnosis not present

## 2023-03-25 DIAGNOSIS — Z299 Encounter for prophylactic measures, unspecified: Secondary | ICD-10-CM | POA: Diagnosis not present

## 2023-03-25 DIAGNOSIS — E7800 Pure hypercholesterolemia, unspecified: Secondary | ICD-10-CM | POA: Diagnosis not present

## 2023-03-25 DIAGNOSIS — Z79899 Other long term (current) drug therapy: Secondary | ICD-10-CM | POA: Diagnosis not present

## 2023-03-25 DIAGNOSIS — I10 Essential (primary) hypertension: Secondary | ICD-10-CM | POA: Diagnosis not present

## 2023-04-09 ENCOUNTER — Ambulatory Visit: Payer: Medicare Other | Admitting: Urology

## 2023-04-09 ENCOUNTER — Encounter: Payer: Self-pay | Admitting: Urology

## 2023-04-09 VITALS — BP 158/74 | HR 94

## 2023-04-09 DIAGNOSIS — N138 Other obstructive and reflux uropathy: Secondary | ICD-10-CM

## 2023-04-09 DIAGNOSIS — C61 Malignant neoplasm of prostate: Secondary | ICD-10-CM | POA: Diagnosis not present

## 2023-04-09 DIAGNOSIS — N401 Enlarged prostate with lower urinary tract symptoms: Secondary | ICD-10-CM

## 2023-04-09 DIAGNOSIS — R339 Retention of urine, unspecified: Secondary | ICD-10-CM

## 2023-04-09 DIAGNOSIS — R351 Nocturia: Secondary | ICD-10-CM

## 2023-04-09 LAB — URINALYSIS, ROUTINE W REFLEX MICROSCOPIC
Bilirubin, UA: NEGATIVE
Ketones, UA: NEGATIVE
Leukocytes,UA: NEGATIVE
Nitrite, UA: NEGATIVE
Protein,UA: NEGATIVE
RBC, UA: NEGATIVE
Specific Gravity, UA: 1.01 (ref 1.005–1.030)
Urobilinogen, Ur: 0.2 mg/dL (ref 0.2–1.0)
pH, UA: 6 (ref 5.0–7.5)

## 2023-04-09 MED ORDER — ALFUZOSIN HCL ER 10 MG PO TB24: 10.0000 mg | ORAL_TABLET | Freq: Every day | ORAL | 3 refills | Status: DC

## 2023-04-09 NOTE — Patient Instructions (Signed)

## 2023-04-09 NOTE — Progress Notes (Signed)
04/09/2023 10:07 AM   Caleb Pugh 05/08/1946 [id number]  Referring provider: Kirstie Peri, MD [address] [place],  [place] [address]  Followup prostate cancer and BPH   HPI: Caleb Pugh is a 77yo here for followup for BPH and prostate cancer. PSA remains undetectable. IPSS 10 QOL 1 on uroxatral 10mg . Nocturia 2-3x. Urine stream strong.  He has finished ADT   PMH: Past Medical History:  Diagnosis Date   BPH (benign prostatic hyperplasia)    Cancer (HCC)    Diabetes mellitus without complication (HCC)    High cholesterol    Hypertension     Surgical History: Past Surgical History:  Procedure Laterality Date   CATARACT EXTRACTION W/PHACO Left 12/11/2020   Procedure: CATARACT EXTRACTION PHACO AND INTRAOCULAR LENS PLACEMENT LEFT EYE;  Surgeon: Fabio Pierce, MD;  Location: [facility];  Service: Ophthalmology;  Laterality: Left;  left CDE=13.96   EVALUATION UNDER ANESTHESIA WITH TEAR DUCT PROBING     GOLD SEED IMPLANT N/A 04/26/2021   Procedure: GOLD SEED IMPLANT;  Surgeon: Malen Gauze, MD;  Location: [facility];  Service: Urology;  Laterality: N/A;   SPACE OAR INSTILLATION N/A 04/26/2021   Procedure: SPACE OAR INSTILLATION;  Surgeon: Malen Gauze, MD;  Location: [facility];  Service: Urology;  Laterality: N/A;   TONSILLECTOMY      Home Medications:  Allergies as of 04/09/2023   No Known Allergies      Medication List        Accurate as of April 09, 2023 10:07 AM. If you have any questions, ask your nurse or doctor.          Accu-Chek Guide test strip Generic drug: glucose blood   alfuzosin 10 MG 24 hr tablet Commonly known as: UROXATRAL Take 1 tablet (10 mg total) by mouth daily with supper.   famotidine 20 MG tablet Commonly known as: PEPCID Take 20 mg by mouth at bedtime as needed for heartburn or indigestion.   lisinopril-hydrochlorothiazide 20-12.5 MG tablet Commonly known as: ZESTORETIC Take 2 tablets by mouth daily.   metFORMIN 500 MG  tablet Commonly known as: GLUCOPHAGE Take 500 mg by mouth daily.   naproxen sodium 220 MG tablet Commonly known as: ALEVE Take 220 mg by mouth daily as needed (pain).   simvastatin 40 MG tablet Commonly known as: ZOCOR Take 40 mg by mouth at bedtime.        Allergies: No Known Allergies  Family History: No family history on file.  Social History:  reports that he has been smoking cigarettes. He has a 30 pack-year smoking history. He has never used smokeless tobacco. He reports that he does not currently use alcohol. He reports that he does not use drugs.  ROS: All other review of systems were reviewed and are negative except what is noted above in HPI  Physical Exam: BP (!) 158/74   Pulse 94   Constitutional:  Alert and oriented, No acute distress. HEENT: [place] AT, moist mucus membranes.  Trachea midline, no masses. Cardiovascular: No clubbing, cyanosis, or edema. Respiratory: Normal respiratory effort, no increased work of breathing. GI: Abdomen is soft, nontender, nondistended, no abdominal masses GU: No CVA tenderness.  Lymph: No cervical or inguinal lymphadenopathy. Skin: No rashes, bruises or suspicious lesions. Neurologic: Grossly intact, no focal deficits, moving all 4 extremities. Psychiatric: Normal mood and affect.  Laboratory Data: Lab Results  Component Value Date   WBC 8.9 05/10/2021   HGB 14.2 05/10/2021   HCT 40.9 05/10/2021  MCV 87.8 05/10/2021   PLT 201 05/10/2021    Lab Results  Component Value Date   CREATININE 1.07 05/10/2021    No results found for: "PSA"  No results found for: "TESTOSTERONE"  Lab Results  Component Value Date   HGBA1C 7.2 (H) 04/24/2021    Urinalysis    Component Value Date/Time   APPEARANCEUR Clear 10/16/2022 1108   GLUCOSEU Trace (A) 10/16/2022 1108   BILIRUBINUR Negative 10/16/2022 1108   PROTEINUR Negative 10/16/2022 1108   NITRITE Negative 10/16/2022 1108   LEUKOCYTESUR Negative 10/16/2022 1108    Lab  Results  Component Value Date   LABMICR Comment 10/16/2022   WBCUA None seen 05/11/2021   LABEPIT 0-10 05/11/2021   MUCUS Present 05/11/2021   BACTERIA None seen 05/11/2021    Pertinent Imaging:  No results found for this or any previous visit.  No results found for this or any previous visit.  No results found for this or any previous visit.  No results found for this or any previous visit.  No results found for this or any previous visit.  No valid procedures specified. No results found for this or any previous visit.  No results found for this or any previous visit.   Assessment & Plan:    1. Prostate cancer (HCC) Followup 6 months with PSA - Urinalysis, Routine w reflex microscopic  2. Incomplete emptying of bladder Continue uroxatral 10mg  qhs  3. Benign prostatic hyperplasia with urinary obstruction Continue uroxatral 10mg  qhs  4. Nocturia -continue uroxatral 10mg  qhs   No follow-ups on file.  Wilkie Aye, MD  [facility] [place]

## 2023-07-01 DIAGNOSIS — Z299 Encounter for prophylactic measures, unspecified: Secondary | ICD-10-CM | POA: Diagnosis not present

## 2023-07-01 DIAGNOSIS — E1169 Type 2 diabetes mellitus with other specified complication: Secondary | ICD-10-CM | POA: Diagnosis not present

## 2023-07-01 DIAGNOSIS — I10 Essential (primary) hypertension: Secondary | ICD-10-CM | POA: Diagnosis not present

## 2023-07-01 DIAGNOSIS — F17210 Nicotine dependence, cigarettes, uncomplicated: Secondary | ICD-10-CM | POA: Diagnosis not present

## 2023-10-01 DIAGNOSIS — C44310 Basal cell carcinoma of skin of unspecified parts of face: Secondary | ICD-10-CM | POA: Diagnosis not present

## 2023-10-01 DIAGNOSIS — Z299 Encounter for prophylactic measures, unspecified: Secondary | ICD-10-CM | POA: Diagnosis not present

## 2023-10-01 DIAGNOSIS — I10 Essential (primary) hypertension: Secondary | ICD-10-CM | POA: Diagnosis not present

## 2023-10-01 DIAGNOSIS — F17210 Nicotine dependence, cigarettes, uncomplicated: Secondary | ICD-10-CM | POA: Diagnosis not present

## 2023-10-01 DIAGNOSIS — E1169 Type 2 diabetes mellitus with other specified complication: Secondary | ICD-10-CM | POA: Diagnosis not present

## 2023-10-06 ENCOUNTER — Other Ambulatory Visit: Payer: Medicare Other

## 2023-10-06 DIAGNOSIS — C61 Malignant neoplasm of prostate: Secondary | ICD-10-CM

## 2023-10-07 LAB — PSA: Prostate Specific Ag, Serum: 0.3 ng/mL (ref 0.0–4.0)

## 2023-10-17 ENCOUNTER — Encounter: Payer: Self-pay | Admitting: Urology

## 2023-10-17 ENCOUNTER — Ambulatory Visit: Payer: Medicare Other | Admitting: Urology

## 2023-10-17 VITALS — BP 109/71 | HR 103

## 2023-10-17 DIAGNOSIS — N138 Other obstructive and reflux uropathy: Secondary | ICD-10-CM | POA: Diagnosis not present

## 2023-10-17 DIAGNOSIS — C61 Malignant neoplasm of prostate: Secondary | ICD-10-CM

## 2023-10-17 DIAGNOSIS — N401 Enlarged prostate with lower urinary tract symptoms: Secondary | ICD-10-CM | POA: Diagnosis not present

## 2023-10-17 DIAGNOSIS — R339 Retention of urine, unspecified: Secondary | ICD-10-CM

## 2023-10-17 LAB — URINALYSIS, ROUTINE W REFLEX MICROSCOPIC
Bilirubin, UA: NEGATIVE
Glucose, UA: NEGATIVE
Ketones, UA: NEGATIVE
Leukocytes,UA: NEGATIVE
Nitrite, UA: NEGATIVE
Protein,UA: NEGATIVE
RBC, UA: NEGATIVE
Specific Gravity, UA: 1.025 (ref 1.005–1.030)
Urobilinogen, Ur: 0.2 mg/dL (ref 0.2–1.0)
pH, UA: 6 (ref 5.0–7.5)

## 2023-10-17 MED ORDER — ALFUZOSIN HCL ER 10 MG PO TB24: 10.0000 mg | ORAL_TABLET | Freq: Every day | ORAL | 3 refills | Status: DC

## 2023-10-17 NOTE — Progress Notes (Signed)
 10/17/2023 9:58 AM   Caleb Pugh September 21, 1945 [id number]  Referring provider: Kirstie Peri, MD [address] [place],  [place] [address]  No chief complaint on file.   HPI: [name] is a 77yo here for followup for prostate cancer and BPh with incomplete emptying/nocturia. PSA 0.3 off ADT. IPSS 6 QOL 1 on uroxatral 10mg  at bedtime. Nocturia 1-3x on uroxatral 10mg  qhs   PMH: Past Medical History:  Diagnosis Date   BPH (benign prostatic hyperplasia)    Cancer (HCC)    Diabetes mellitus without complication (HCC)    High cholesterol    Hypertension     Surgical History: Past Surgical History:  Procedure Laterality Date   CATARACT EXTRACTION W/PHACO Left 12/11/2020   Procedure: CATARACT EXTRACTION PHACO AND INTRAOCULAR LENS PLACEMENT LEFT EYE;  Surgeon: Fabio Pierce, MD;  Location: [facility];  Service: Ophthalmology;  Laterality: Left;  left CDE=13.96   EVALUATION UNDER ANESTHESIA WITH TEAR DUCT PROBING     GOLD SEED IMPLANT N/A 04/26/2021   Procedure: GOLD SEED IMPLANT;  Surgeon: Malen Gauze, MD;  Location: [facility];  Service: Urology;  Laterality: N/A;   SPACE OAR INSTILLATION N/A 04/26/2021   Procedure: SPACE OAR INSTILLATION;  Surgeon: Malen Gauze, MD;  Location: [facility];  Service: Urology;  Laterality: N/A;   TONSILLECTOMY      Home Medications:  Allergies as of 10/17/2023   No Known Allergies      Medication List        Accurate as of October 17, 2023  9:58 AM. If you have any questions, ask your nurse or doctor.          Accu-Chek Guide test strip Generic drug: glucose blood   alfuzosin 10 MG 24 hr tablet Commonly known as: UROXATRAL Take 1 tablet (10 mg total) by mouth daily with supper.   famotidine 20 MG tablet Commonly known as: PEPCID Take 20 mg by mouth at bedtime as needed for heartburn or indigestion.   lisinopril-hydrochlorothiazide 20-12.5 MG tablet Commonly known as: ZESTORETIC Take 2 tablets by mouth daily.   metFORMIN  500 MG tablet Commonly known as: GLUCOPHAGE Take 500 mg by mouth daily.   naproxen sodium 220 MG tablet Commonly known as: ALEVE Take 220 mg by mouth daily as needed (pain).   simvastatin 40 MG tablet Commonly known as: ZOCOR Take 40 mg by mouth at bedtime.        Allergies: No Known Allergies  Family History: No family history on file.  Social History:  reports that he has been smoking cigarettes. He has a 30 pack-year smoking history. He has never used smokeless tobacco. He reports that he does not currently use alcohol. He reports that he does not use drugs.  ROS: All other review of systems were reviewed and are negative except what is noted above in HPI  Physical Exam: BP 109/71   Pulse (!) 103   Constitutional:  Alert and oriented, No acute distress. HEENT: [place] AT, moist mucus membranes.  Trachea midline, no masses. Cardiovascular: No clubbing, cyanosis, or edema. Respiratory: Normal respiratory effort, no increased work of breathing. GI: Abdomen is soft, nontender, nondistended, no abdominal masses GU: No CVA tenderness.  Lymph: No cervical or inguinal lymphadenopathy. Skin: No rashes, bruises or suspicious lesions. Neurologic: Grossly intact, no focal deficits, moving all 4 extremities. Psychiatric: Normal mood and affect.  Laboratory Data: Lab Results  Component Value Date   WBC 8.9 05/10/2021   HGB 14.2 05/10/2021   HCT 40.9  05/10/2021   MCV 87.8 05/10/2021   PLT 201 05/10/2021    Lab Results  Component Value Date   CREATININE 1.07 05/10/2021    No results found for: "PSA"  No results found for: "TESTOSTERONE"  Lab Results  Component Value Date   HGBA1C 7.2 (H) 04/24/2021    Urinalysis    Component Value Date/Time   APPEARANCEUR Clear 04/09/2023 0920   GLUCOSEU 3+ (A) 04/09/2023 0920   BILIRUBINUR Negative 04/09/2023 0920   PROTEINUR Negative 04/09/2023 0920   NITRITE Negative 04/09/2023 0920   LEUKOCYTESUR Negative 04/09/2023 0920     Lab Results  Component Value Date   LABMICR Comment 04/09/2023   WBCUA None seen 05/11/2021   LABEPIT 0-10 05/11/2021   MUCUS Present 05/11/2021   BACTERIA None seen 05/11/2021    Pertinent Imaging:  No results found for this or any previous visit.  No results found for this or any previous visit.  No results found for this or any previous visit.  No results found for this or any previous visit.  No results found for this or any previous visit.  No results found for this or any previous visit.  No results found for this or any previous visit.  No results found for this or any previous visit.   Assessment & Plan:    1. Prostate cancer (HCC) (Primary) Followup 6 months with PSA - Urinalysis, Routine w reflex microscopic  2. Incomplete emptying of bladder Continue uroxatral 10mg  qhs  3. Benign prostatic hyperplasia with urinary obstruction Continue uroxatral 10mg  at bedtime    No follow-ups on file.  Wilkie Aye, MD  [facility] [place]

## 2023-10-17 NOTE — Patient Instructions (Signed)

## 2023-10-17 NOTE — Addendum Note (Signed)
 Addended by: Malen Gauze on: 10/17/2023 10:01 AM   Modules accepted: Orders

## 2023-11-18 DIAGNOSIS — I10 Essential (primary) hypertension: Secondary | ICD-10-CM | POA: Diagnosis not present

## 2023-11-18 DIAGNOSIS — I700 Atherosclerosis of aorta: Secondary | ICD-10-CM | POA: Diagnosis not present

## 2023-11-18 DIAGNOSIS — Z299 Encounter for prophylactic measures, unspecified: Secondary | ICD-10-CM | POA: Diagnosis not present

## 2023-11-18 DIAGNOSIS — Z7189 Other specified counseling: Secondary | ICD-10-CM | POA: Diagnosis not present

## 2023-11-18 DIAGNOSIS — Z0000 Encounter for general adult medical examination without abnormal findings: Secondary | ICD-10-CM | POA: Diagnosis not present

## 2024-01-02 DIAGNOSIS — I10 Essential (primary) hypertension: Secondary | ICD-10-CM | POA: Diagnosis not present

## 2024-01-02 DIAGNOSIS — F17210 Nicotine dependence, cigarettes, uncomplicated: Secondary | ICD-10-CM | POA: Diagnosis not present

## 2024-01-02 DIAGNOSIS — Z299 Encounter for prophylactic measures, unspecified: Secondary | ICD-10-CM | POA: Diagnosis not present

## 2024-01-02 DIAGNOSIS — C44310 Basal cell carcinoma of skin of unspecified parts of face: Secondary | ICD-10-CM | POA: Diagnosis not present

## 2024-01-02 DIAGNOSIS — E1169 Type 2 diabetes mellitus with other specified complication: Secondary | ICD-10-CM | POA: Diagnosis not present

## 2024-03-29 DIAGNOSIS — Z79899 Other long term (current) drug therapy: Secondary | ICD-10-CM | POA: Diagnosis not present

## 2024-03-29 DIAGNOSIS — I10 Essential (primary) hypertension: Secondary | ICD-10-CM | POA: Diagnosis not present

## 2024-03-29 DIAGNOSIS — Z0000 Encounter for general adult medical examination without abnormal findings: Secondary | ICD-10-CM | POA: Diagnosis not present

## 2024-03-29 DIAGNOSIS — Z299 Encounter for prophylactic measures, unspecified: Secondary | ICD-10-CM | POA: Diagnosis not present

## 2024-04-16 ENCOUNTER — Other Ambulatory Visit: Payer: Medicare Other

## 2024-04-16 DIAGNOSIS — C61 Malignant neoplasm of prostate: Secondary | ICD-10-CM

## 2024-04-17 LAB — PSA: Prostate Specific Ag, Serum: 1.2 ng/mL (ref 0.0–4.0)

## 2024-04-23 ENCOUNTER — Encounter: Payer: Self-pay | Admitting: Urology

## 2024-04-23 ENCOUNTER — Ambulatory Visit: Payer: Medicare Other | Admitting: Urology

## 2024-04-23 VITALS — BP 165/75 | HR 97

## 2024-04-23 DIAGNOSIS — R339 Retention of urine, unspecified: Secondary | ICD-10-CM

## 2024-04-23 DIAGNOSIS — N138 Other obstructive and reflux uropathy: Secondary | ICD-10-CM | POA: Diagnosis not present

## 2024-04-23 DIAGNOSIS — C61 Malignant neoplasm of prostate: Secondary | ICD-10-CM | POA: Diagnosis not present

## 2024-04-23 DIAGNOSIS — N401 Enlarged prostate with lower urinary tract symptoms: Secondary | ICD-10-CM | POA: Diagnosis not present

## 2024-04-23 LAB — URINALYSIS, ROUTINE W REFLEX MICROSCOPIC
Bilirubin, UA: NEGATIVE
Ketones, UA: NEGATIVE
Leukocytes,UA: NEGATIVE
Nitrite, UA: NEGATIVE
Protein,UA: NEGATIVE
RBC, UA: NEGATIVE
Specific Gravity, UA: 1.015 (ref 1.005–1.030)
Urobilinogen, Ur: 0.2 mg/dL (ref 0.2–1.0)
pH, UA: 7 (ref 5.0–7.5)

## 2024-04-23 MED ORDER — ALFUZOSIN HCL ER 10 MG PO TB24: 10.0000 mg | ORAL_TABLET | Freq: Every day | ORAL | 3 refills | Status: AC

## 2024-04-23 NOTE — Progress Notes (Signed)
 04/23/2024 9:16 AM   Caleb Pugh [birth year] [id number]  Referring provider: Maree Isles, MD [address] [place],  [place] [address]  Followup BPH and prostate cancer   HPI: Mr Mangan is a 78yo here for followupo for BPH and Prostate cancer. PSA increased to 1.2 from 0.3. He is off ADT. IPSS 7 QOl 1 on uroxatral  10mg  daily. Nocturia 2x. Urine stream strong. No straining to urinate.    PMH: Past Medical History:  Diagnosis Date   BPH (benign prostatic hyperplasia)    Cancer (HCC)    Diabetes mellitus without complication (HCC)    High cholesterol    Hypertension     Surgical History: Past Surgical History:  Procedure Laterality Date   CATARACT EXTRACTION W/PHACO Left 12/11/2020   Procedure: CATARACT EXTRACTION PHACO AND INTRAOCULAR LENS PLACEMENT LEFT EYE;  Surgeon: Harrie Agent, MD;  Location: [facility];  Service: Ophthalmology;  Laterality: Left;  left CDE=13.96   EVALUATION UNDER ANESTHESIA WITH TEAR DUCT PROBING     GOLD SEED IMPLANT N/A 04/26/2021   Procedure: GOLD SEED IMPLANT;  Surgeon: Sherrilee Belvie CROME, MD;  Location: [facility];  Service: Urology;  Laterality: N/A;   SPACE OAR INSTILLATION N/A 04/26/2021   Procedure: SPACE OAR INSTILLATION;  Surgeon: Sherrilee Belvie CROME, MD;  Location: [facility];  Service: Urology;  Laterality: N/A;   TONSILLECTOMY      Home Medications:  Allergies as of 04/23/2024   No Known Allergies      Medication List        Accurate as of April 23, 2024  9:16 AM. If you have any questions, ask your nurse or doctor.          Accu-Chek Guide test strip Generic drug: glucose blood   alfuzosin  10 MG 24 hr tablet Commonly known as: UROXATRAL  Take 1 tablet (10 mg total) by mouth daily with supper.   famotidine  20 MG tablet Commonly known as: PEPCID  Take 20 mg by mouth at bedtime as needed for heartburn or indigestion.   lisinopril -hydrochlorothiazide 20-12.5 MG tablet Commonly known as: ZESTORETIC  Take 2 tablets by mouth  daily.   metFORMIN  500 MG tablet Commonly known as: GLUCOPHAGE  Take 500 mg by mouth daily.   naproxen sodium 220 MG tablet Commonly known as: ALEVE Take 220 mg by mouth daily as needed (pain).   simvastatin  40 MG tablet Commonly known as: ZOCOR  Take 40 mg by mouth at bedtime.        Allergies: No Known Allergies  Family History: No family history on file.  Social History:  reports that he has been smoking cigarettes. He has a 30 pack-year smoking history. He has never used smokeless tobacco. He reports that he does not currently use alcohol. He reports that he does not use drugs.  ROS: All other review of systems were reviewed and are negative except what is noted above in HPI  Physical Exam: BP (!) 165/75   Pulse 97   Constitutional:  Alert and oriented, No acute distress. HEENT: [place] AT, moist mucus membranes.  Trachea midline, no masses. Cardiovascular: No clubbing, cyanosis, or edema. Respiratory: Normal respiratory effort, no increased work of breathing. GI: Abdomen is soft, nontender, nondistended, no abdominal masses GU: No CVA tenderness.  Lymph: No cervical or inguinal lymphadenopathy. Skin: No rashes, bruises or suspicious lesions. Neurologic: Grossly intact, no focal deficits, moving all 4 extremities. Psychiatric: Normal mood and affect.  Laboratory Data: Lab Results  Component Value Date   WBC 8.9 05/10/2021   HGB  14.2 05/10/2021   HCT 40.9 05/10/2021   MCV 87.8 05/10/2021   PLT 201 05/10/2021    Lab Results  Component Value Date   CREATININE 1.07 05/10/2021    No results found for: PSA  No results found for: TESTOSTERONE   Lab Results  Component Value Date   HGBA1C 7.2 (H) 04/24/2021    Urinalysis    Component Value Date/Time   APPEARANCEUR Clear 10/17/2023 0953   GLUCOSEU Negative 10/17/2023 0953   BILIRUBINUR Negative 10/17/2023 0953   PROTEINUR Negative 10/17/2023 0953   NITRITE Negative 10/17/2023 0953   LEUKOCYTESUR  Negative 10/17/2023 0953    Lab Results  Component Value Date   LABMICR Comment 10/17/2023   WBCUA None seen 05/11/2021   LABEPIT 0-10 05/11/2021   MUCUS Present 05/11/2021   BACTERIA None seen 05/11/2021    Pertinent Imaging:  No results found for this or any previous visit.  No results found for this or any previous visit.  No results found for this or any previous visit.  No results found for this or any previous visit.  No results found for this or any previous visit.  No results found for this or any previous visit.  No results found for this or any previous visit.  No results found for this or any previous visit.   Assessment & Plan:    1. Prostate cancer (HCC) (Primary) -followup 6 months with a PSA - Urinalysis, Routine w reflex microscopic  2. Incomplete emptying of bladder -uroxatral  10mg    3. Benign prostatic hyperplasia with urinary obstruction Continue uroxatral  10mg     No follow-ups on file.  Belvie Clara, MD  [facility] [place]

## 2024-04-23 NOTE — Patient Instructions (Signed)

## 2024-04-27 DIAGNOSIS — E119 Type 2 diabetes mellitus without complications: Secondary | ICD-10-CM | POA: Diagnosis not present

## 2024-04-27 DIAGNOSIS — Z299 Encounter for prophylactic measures, unspecified: Secondary | ICD-10-CM | POA: Diagnosis not present

## 2024-04-27 DIAGNOSIS — I10 Essential (primary) hypertension: Secondary | ICD-10-CM | POA: Diagnosis not present

## 2024-10-21 ENCOUNTER — Other Ambulatory Visit

## 2024-10-25 ENCOUNTER — Ambulatory Visit: Admitting: Urology
